# Patient Record
Sex: Female | Born: 1970 | Race: White | Hispanic: No | Marital: Married | Smoking: Never smoker
Health system: Southern US, Community
[De-identification: ages and names within clinical notes are randomized; demographics above are authoritative.]

## PROBLEM LIST (undated history)

## (undated) DIAGNOSIS — Z8619 Personal history of other infectious and parasitic diseases: Secondary | ICD-10-CM

## (undated) DIAGNOSIS — J45909 Unspecified asthma, uncomplicated: Secondary | ICD-10-CM

## (undated) DIAGNOSIS — Z87440 Personal history of urinary (tract) infections: Secondary | ICD-10-CM

## (undated) DIAGNOSIS — T7840XA Allergy, unspecified, initial encounter: Secondary | ICD-10-CM

## (undated) DIAGNOSIS — Z87718 Personal history of other specified (corrected) congenital malformations of genitourinary system: Secondary | ICD-10-CM

## (undated) DIAGNOSIS — Z8709 Personal history of other diseases of the respiratory system: Secondary | ICD-10-CM

## (undated) DIAGNOSIS — Z8742 Personal history of other diseases of the female genital tract: Secondary | ICD-10-CM

## (undated) HISTORY — PX: WISDOM TOOTH EXTRACTION: SHX21

## (undated) HISTORY — DX: Personal history of other infectious and parasitic diseases: Z86.19

## (undated) HISTORY — DX: Allergy, unspecified, initial encounter: T78.40XA

## (undated) HISTORY — DX: Unspecified asthma, uncomplicated: J45.909

## (undated) HISTORY — DX: Personal history of other specified (corrected) congenital malformations of genitourinary system: Z87.718

## (undated) HISTORY — DX: Personal history of other diseases of the respiratory system: Z87.09

## (undated) HISTORY — PX: OTHER SURGICAL HISTORY: SHX169

## (undated) HISTORY — DX: Personal history of urinary (tract) infections: Z87.440

## (undated) HISTORY — DX: Personal history of other diseases of the female genital tract: Z87.42

---

## 2015-05-20 LAB — HM MAMMOGRAPHY: HM MAMMO: NORMAL (ref 0–4)

## 2015-05-20 LAB — HM PAP SMEAR

## 2016-05-16 ENCOUNTER — Telehealth: Payer: Self-pay | Admitting: Emergency Medicine

## 2016-05-16 ENCOUNTER — Encounter: Payer: Self-pay | Admitting: Emergency Medicine

## 2016-05-16 NOTE — Telephone Encounter (Signed)
Pre-Visit Call completed with patient and chart updated.   Pre-Visit Info documented in Specialty Comments under Snap[employer].    

## 2016-05-19 ENCOUNTER — Encounter: Payer: Self-pay | Admitting: General Practice

## 2016-05-19 ENCOUNTER — Encounter: Payer: Self-pay | Admitting: Family Medicine

## 2016-05-19 ENCOUNTER — Telehealth: Payer: Self-pay | Admitting: Family Medicine

## 2016-05-19 ENCOUNTER — Ambulatory Visit (INDEPENDENT_AMBULATORY_CARE_PROVIDER_SITE_OTHER): Payer: Managed Care, Other (non HMO) | Admitting: Family Medicine

## 2016-05-19 VITALS — BP 102/64 | HR 71 | Temp 98.8°F | Resp 16 | Ht 65.0 in | Wt 156.4 lb

## 2016-05-19 DIAGNOSIS — Z0000 Encounter for general adult medical examination without abnormal findings: Secondary | ICD-10-CM

## 2016-05-19 DIAGNOSIS — Z23 Encounter for immunization: Secondary | ICD-10-CM

## 2016-05-19 LAB — HEPATIC FUNCTION PANEL
ALT: 15 U/L (ref 0–35)
AST: 17 U/L (ref 0–37)
Albumin: 4.5 g/dL (ref 3.5–5.2)
Alkaline Phosphatase: 44 U/L (ref 39–117)
BILIRUBIN TOTAL: 0.6 mg/dL (ref 0.2–1.2)
Bilirubin, Direct: 0.1 mg/dL (ref 0.0–0.3)
Total Protein: 7.2 g/dL (ref 6.0–8.3)

## 2016-05-19 LAB — CBC WITH DIFFERENTIAL/PLATELET
BASOS PCT: 0.9 % (ref 0.0–3.0)
Basophils Absolute: 0 10*3/uL (ref 0.0–0.1)
EOS PCT: 2.1 % (ref 0.0–5.0)
Eosinophils Absolute: 0.1 10*3/uL (ref 0.0–0.7)
HEMATOCRIT: 45.4 % (ref 36.0–46.0)
HEMOGLOBIN: 15.8 g/dL — AB (ref 12.0–15.0)
Lymphocytes Relative: 42.3 % (ref 12.0–46.0)
Lymphs Abs: 1.8 10*3/uL (ref 0.7–4.0)
MCHC: 34.9 g/dL (ref 30.0–36.0)
MCV: 90.1 fl (ref 78.0–100.0)
MONO ABS: 0.4 10*3/uL (ref 0.1–1.0)
MONOS PCT: 9.4 % (ref 3.0–12.0)
Neutro Abs: 1.9 10*3/uL (ref 1.4–7.7)
Neutrophils Relative %: 45.3 % (ref 43.0–77.0)
Platelets: 167 10*3/uL (ref 150.0–400.0)
RBC: 5.04 Mil/uL (ref 3.87–5.11)
RDW: 13 % (ref 11.5–15.5)
WBC: 4.1 10*3/uL (ref 4.0–10.5)

## 2016-05-19 LAB — BASIC METABOLIC PANEL
BUN: 17 mg/dL (ref 6–23)
CHLORIDE: 105 meq/L (ref 96–112)
CO2: 30 mEq/L (ref 19–32)
CREATININE: 0.93 mg/dL (ref 0.40–1.20)
Calcium: 9.1 mg/dL (ref 8.4–10.5)
GFR: 69.26 mL/min (ref >60.00)
Glucose, Bld: 84 mg/dL (ref 70–99)
POTASSIUM: 4.4 meq/L (ref 3.5–5.1)
Sodium: 140 mEq/L (ref 135–145)

## 2016-05-19 LAB — LIPID PANEL
CHOL/HDL RATIO: 3
Cholesterol: 164 mg/dL (ref 0–200)
HDL: 60.6 mg/dL (ref >39.00)
LDL Cholesterol: 86 mg/dL (ref 0–99)
NONHDL: 103.53
Triglycerides: 90 mg/dL (ref 0.0–149.0)
VLDL: 18 mg/dL (ref 0.0–40.0)

## 2016-05-19 LAB — TSH: TSH: 3.59 u[IU]/mL (ref 0.35–4.50)

## 2016-05-19 LAB — VITAMIN D 25 HYDROXY (VIT D DEFICIENCY, FRACTURES): VITD: 30.59 ng/mL (ref 30.00–100.00)

## 2016-05-19 NOTE — Patient Instructions (Signed)
Follow up in 1 year or as needed We'll notify you of your lab results and make any changes if needed Keep up the good work on healthy diet and regular exercise- you look great! Call with any questions or concerns Welcome!  We're glad to have you!!! 

## 2016-05-19 NOTE — Telephone Encounter (Signed)
Pt states she forgot to ask for a note/letter stating that she receiving her flu shot today, pt states she needs this for work.

## 2016-05-19 NOTE — Progress Notes (Signed)
Pre visit review using our clinic review tool, if applicable. No additional management support is needed unless otherwise documented below in the visit note. 

## 2016-05-19 NOTE — Progress Notes (Signed)
   Subjective:    Patient ID: Kristi Dillon, female    DOB: 1970/11/10, 45 y.o.   MRN: [id number]  HPI New to establish.  Previous MD- [facility]  GYN- [facility] (UTD)  CPE- no concerns   Review of Systems Patient reports no vision/ hearing changes, adenopathy,fever, weight change,  persistant/recurrent hoarseness , swallowing issues, chest pain, palpitations, edema, persistant/recurrent cough, hemoptysis, dyspnea (rest/exertional/paroxysmal nocturnal), gastrointestinal bleeding (melena, rectal bleeding), abdominal pain, significant heartburn, bowel changes, GU symptoms (dysuria, hematuria, incontinence), Gyn symptoms (abnormal  bleeding, pain),  syncope, focal weakness, memory loss, numbness & tingling, skin/hair/nail changes, abnormal bruising or bleeding, anxiety, or depression.     Objective:   Physical Exam General Appearance:    Alert, cooperative, no distress, appears stated age  Head:    Normocephalic, without obvious abnormality, atraumatic  Eyes:    PERRL, conjunctiva/corneas clear, EOM's intact, fundi    benign, both eyes  Ears:    Normal TM's and external ear canals, both ears  Nose:   Nares normal, septum midline, mucosa normal, no drainage    or sinus tenderness  Throat:   Lips, mucosa, and tongue normal; teeth and gums normal  Neck:   Supple, symmetrical, trachea midline, no adenopathy;    Thyroid: no enlargement/tenderness/nodules  Back:     Symmetric, no curvature, ROM normal, no CVA tenderness  Lungs:     Clear to auscultation bilaterally, respirations unlabored  Chest Wall:    No tenderness or deformity   Heart:    Regular rate and rhythm, S1 and S2 normal, no murmur, rub   or gallop  Breast Exam:    Deferred to GYN  Abdomen:     Soft, non-tender, bowel sounds active all four quadrants,    no masses, no organomegaly  Genitalia:    Deferred to GYN  Rectal:    Extremities:   Extremities normal, atraumatic, no cyanosis or edema  Pulses:   2+ and symmetric all  extremities  Skin:   Skin color, texture, turgor normal, no rashes or lesions  Lymph nodes:   Cervical, supraclavicular, and axillary nodes normal  Neurologic:   CNII-XII intact, normal strength, sensation and reflexes    throughout          Assessment & Plan:  PE- UTD on GYN.  Check labs.  Flu shot given.  Anticipatory guidance provided.

## 2016-05-19 NOTE — Telephone Encounter (Signed)
Letter written and pt was informed available at front desk for pick up.

## 2016-06-16 ENCOUNTER — Ambulatory Visit: Payer: Self-pay | Admitting: Family Medicine

## 2016-09-22 ENCOUNTER — Encounter: Payer: Self-pay | Admitting: Physician Assistant

## 2016-09-22 ENCOUNTER — Ambulatory Visit (INDEPENDENT_AMBULATORY_CARE_PROVIDER_SITE_OTHER): Payer: Managed Care, Other (non HMO) | Admitting: Physician Assistant

## 2016-09-22 VITALS — BP 110/78 | HR 70 | Temp 97.7°F | Resp 16 | Ht 65.0 in | Wt 162.0 lb

## 2016-09-22 DIAGNOSIS — J0100 Acute maxillary sinusitis, unspecified: Secondary | ICD-10-CM

## 2016-09-22 MED ORDER — BENZONATATE 100 MG PO CAPS: 100.0000 mg | ORAL_CAPSULE | Freq: Two times a day (BID) | ORAL | 0 refills | Status: DC | PRN

## 2016-09-22 MED ORDER — AMOXICILLIN-POT CLAVULANATE 875-125 MG PO TABS
1.0000 | ORAL_TABLET | Freq: Two times a day (BID) | ORAL | 0 refills | Status: DC
Start: 2016-09-22 — End: 2016-12-10

## 2016-09-22 NOTE — Progress Notes (Signed)
Pre visit review using our clinic review tool, if applicable. No additional management support is needed unless otherwise documented below in the visit note. 

## 2016-09-22 NOTE — Progress Notes (Signed)
Patient presents to clinic today c/o 4 weeks of cough with sinus congestion, pressure and pain along with PND and sore throat. Endorses maxillary facial pain.   Denies fever, chills or aches.  Patient is not a smoker. Denies history of COPD. Has history of allergic asthma -- not currently on any treatment.  Does note some mild chest tightness. Denies wheezing.   Has used Neti pot and Tylenol sinus to help with symptoms. Only mild help with symptoms.  Past Medical History:  Diagnosis Date  . Allergy   . Asthma   . History of chicken pox   . History of chronic bronchitis   . History of Mayer-Rokitansky-Kster-Hauser syndrome type II   . History of urinary tract infection     Current Outpatient Prescriptions on File Prior to Visit  Medication Sig Dispense Refill  . diphenhydrAMINE (BENADRYL) 25 MG tablet Take by mouth.     No current facility-administered medications on file prior to visit.     Allergies  Allergen Reactions  . Prednisone Rash    Family History  Problem Relation Age of Onset  . Hyperlipidemia Father   . Arthritis Father   . Heart disease Father   . Cancer Maternal Grandmother     Skin  . Arthritis Maternal Grandmother   . Alzheimer's disease Maternal Grandmother   . Arthritis Paternal Grandmother   . Cancer Paternal Grandmother     breast  . Cancer Paternal Grandfather     Stomach  . Arthritis Paternal Grandfather   . Alcohol abuse Paternal Uncle   . Arthritis Maternal Grandfather   . Cancer Paternal Uncle     lung    Social History   Social History  . Marital status: Married    Spouse name: N/A  . Number of children: N/A  . Years of education: N/A   Social History Main Topics  . Smoking status: Never Smoker  . Smokeless tobacco: Never Used  . Alcohol use Yes     Comment: 2-3 a week  . Drug use: No  . Sexual activity: Not Asked   Other Topics Concern  . None   Social History Narrative  . None   Review of Systems - See HPI.   All other ROS are negative.  BP 110/78   Pulse 70   Temp 97.7 F (36.5 C) (Oral)   Resp 16   Ht 5\' 5"  (1.651 m)   Wt 162 lb (73.5 kg)   SpO2 99%   BMI 26.96 kg/m   Physical Exam  Constitutional: She is oriented to person, place, and time and well-developed, well-nourished, and in no distress.  HENT:  Head: Normocephalic and atraumatic.  Right Ear: Tympanic membrane normal.  Left Ear: Tympanic membrane normal.  Nose: Right sinus exhibits maxillary sinus tenderness. Left sinus exhibits maxillary sinus tenderness.  Mouth/Throat: Uvula is midline, oropharynx is clear and moist and mucous membranes are normal.  Eyes: Conjunctivae are normal.  Neck: Neck supple.  Cardiovascular: Normal rate, regular rhythm, normal heart sounds and intact distal pulses.   Pulmonary/Chest: Effort normal and breath sounds normal. No respiratory distress. She has no wheezes. She has no rales. She exhibits no tenderness.  Lymphadenopathy:    She has no cervical adenopathy.  Neurological: She is alert and oriented to person, place, and time.  Skin: Skin is warm and dry. No rash noted.  Psychiatric: Affect normal.  Vitals reviewed.  Assessment/Plan: 1. Acute non-recurrent maxillary sinusitis Rx Augmentin. Tessalon per orders. OTC medications and  supportive measures reviewed. FU if not resolving.   - amoxicillin-clavulanate (AUGMENTIN) 875-125 MG tablet; Take 1 tablet by mouth 2 (two) times daily.  Dispense: 14 tablet; Refill: 0 - benzonatate (TESSALON) 100 MG capsule; Take 1 capsule (100 mg total) by mouth 2 (two) times daily as needed for cough.  Dispense: 20 capsule; Refill: 0   Leeanne Rio, [place]

## 2016-09-22 NOTE — Patient Instructions (Signed)
Please take antibiotic as directed.  Increase fluid intake.  Use Saline nasal spray.  Take a daily multivitamin. Continue Tylenol Sinus.  Place a humidifier in the bedroom.  Please call or return clinic if symptoms are not improving.  Sinusitis Sinusitis is redness, soreness, and swelling (inflammation) of the paranasal sinuses. Paranasal sinuses are air pockets within the bones of your face (beneath the eyes, the middle of the forehead, or above the eyes). In healthy paranasal sinuses, mucus is able to drain out, and air is able to circulate through them by way of your nose. However, when your paranasal sinuses are inflamed, mucus and air can become trapped. This can allow bacteria and other germs to grow and cause infection. Sinusitis can develop quickly and last only a short time (acute) or continue over a long period (chronic). Sinusitis that lasts for more than 12 weeks is considered chronic.  CAUSES  Causes of sinusitis include:  Allergies.  Structural abnormalities, such as displacement of the cartilage that separates your nostrils (deviated septum), which can decrease the air flow through your nose and sinuses and affect sinus drainage.  Functional abnormalities, such as when the small hairs (cilia) that line your sinuses and help remove mucus do not work properly or are not present. SYMPTOMS  Symptoms of acute and chronic sinusitis are the same. The primary symptoms are pain and pressure around the affected sinuses. Other symptoms include:  Upper toothache.  Earache.  Headache.  Bad breath.  Decreased sense of smell and taste.  A cough, which worsens when you are lying flat.  Fatigue.  Fever.  Thick drainage from your nose, which often is green and may contain pus (purulent).  Swelling and warmth over the affected sinuses. DIAGNOSIS  Your caregiver will perform a physical exam. During the exam, your caregiver may:  Look in your nose for signs of abnormal growths in your  nostrils (nasal polyps).  Tap over the affected sinus to check for signs of infection.  View the inside of your sinuses (endoscopy) with a special imaging device with a light attached (endoscope), which is inserted into your sinuses. If your caregiver suspects that you have chronic sinusitis, one or more of the following tests may be recommended:  Allergy tests.  Nasal culture A sample of mucus is taken from your nose and sent to a lab and screened for bacteria.  Nasal cytology A sample of mucus is taken from your nose and examined by your caregiver to determine if your sinusitis is related to an allergy. TREATMENT  Most cases of acute sinusitis are related to a viral infection and will resolve on their own within 10 days. Sometimes medicines are prescribed to help relieve symptoms (pain medicine, decongestants, nasal steroid sprays, or saline sprays).  However, for sinusitis related to a bacterial infection, your caregiver will prescribe antibiotic medicines. These are medicines that will help kill the bacteria causing the infection.  Rarely, sinusitis is caused by a fungal infection. In theses cases, your caregiver will prescribe antifungal medicine. For some cases of chronic sinusitis, surgery is needed. Generally, these are cases in which sinusitis recurs more than 3 times per year, despite other treatments. HOME CARE INSTRUCTIONS   Drink plenty of water. Water helps thin the mucus so your sinuses can drain more easily.  Use a humidifier.  Inhale steam 3 to 4 times a day (for example, sit in the bathroom with the shower running).  Apply a warm, moist washcloth to your face 3 to 4  times a day, or as directed by your caregiver.  Use saline nasal sprays to help moisten and clean your sinuses.  Take over-the-counter or prescription medicines for pain, discomfort, or fever only as directed by your caregiver. SEEK IMMEDIATE MEDICAL CARE IF:  You have increasing pain or severe  headaches.  You have nausea, vomiting, or drowsiness.  You have swelling around your face.  You have vision problems.  You have a stiff neck.  You have difficulty breathing. MAKE SURE YOU:   Understand these instructions.  Will watch your condition.  Will get help right away if you are not doing well or get worse. Document Released: 08/11/2005 Document Revised: 11/03/2011 Document Reviewed: 08/26/2011 [employer] Patient Information 2014 [employer].   

## 2016-12-10 ENCOUNTER — Ambulatory Visit (INDEPENDENT_AMBULATORY_CARE_PROVIDER_SITE_OTHER): Payer: Managed Care, Other (non HMO) | Admitting: Family Medicine

## 2016-12-10 ENCOUNTER — Encounter: Payer: Self-pay | Admitting: Family Medicine

## 2016-12-10 VITALS — BP 111/80 | HR 78 | Temp 98.1°F | Resp 16 | Ht 65.0 in | Wt 164.5 lb

## 2016-12-10 DIAGNOSIS — R350 Frequency of micturition: Secondary | ICD-10-CM | POA: Diagnosis not present

## 2016-12-10 DIAGNOSIS — R399 Unspecified symptoms and signs involving the genitourinary system: Secondary | ICD-10-CM

## 2016-12-10 LAB — POCT URINALYSIS DIPSTICK
BILIRUBIN UA: NEGATIVE
Glucose, UA: NEGATIVE
KETONES UA: NEGATIVE
Leukocytes, UA: NEGATIVE
Nitrite, UA: NEGATIVE
PH UA: 6 (ref 5.0–8.0)
PROTEIN UA: NEGATIVE
RBC UA: NEGATIVE
Urobilinogen, UA: 0.2 E.U./dL

## 2016-12-10 MED ORDER — MIRABEGRON ER 25 MG PO TB24: 25.0000 mg | ORAL_TABLET | Freq: Every day | ORAL | 6 refills | Status: DC

## 2016-12-10 NOTE — Assessment & Plan Note (Signed)
New.  Pt reports she has always gone to the bathroom frequently but since Friday there has been a marked change.  Pt is now needing to use the bathroom every 20-30 minutes.  Will send urine for culture but UA is WNL.  Start Myrbetriq daily and monitor for improvement.

## 2016-12-10 NOTE — Progress Notes (Signed)
   Subjective:    Patient ID: Kristi Dillon, female    DOB: February 25, 1971, 46 y.o.   MRN: [id number]  HPI Urinary frequency- 'i'm spending a ton of time in the restroom lately'.  Pt reports she has always gone frequently and it's a running joke in the family but sxs have worsened lately.  Pt feels the urge to void every 20-30 minutes.  If she pushed it to the 1 hr mark 'it's a mad dash' and sometimes 'barely makes it'.  Pt has attempted to cut back on fluid intake but this has not made a difference.  sxs started suddenly on Friday.  No burning, no blood in urine.     Review of Systems For ROS see HPI     Objective:   Physical Exam  Constitutional: She is oriented to person, place, and time. She appears well-developed and well-nourished. No distress.  HENT:  Head: Normocephalic and atraumatic.  Abdominal: Soft. Bowel sounds are normal. She exhibits no distension. There is no tenderness. There is no rebound and no guarding.  Neurological: She is alert and oriented to person, place, and time.  Skin: Skin is warm and dry.  Psychiatric: She has a normal mood and affect. Her behavior is normal. Thought content normal.  Vitals reviewed.         Assessment & Plan:

## 2016-12-10 NOTE — Patient Instructions (Signed)
Follow up in 2-3 weeks and let me know how the urinary frequency is going We'll notify you of your urine culture results and treat if needed Start the Myrbetriq once daily Call with any questions or concerns Hang in there!!!

## 2016-12-10 NOTE — Progress Notes (Signed)
Pre visit review using our clinic review tool, if applicable. No additional management support is needed unless otherwise documented below in the visit note. 

## 2016-12-11 ENCOUNTER — Other Ambulatory Visit: Payer: Self-pay | Admitting: General Practice

## 2016-12-11 MED ORDER — MIRABEGRON ER 25 MG PO TB24: 25.0000 mg | ORAL_TABLET | Freq: Every day | ORAL | 0 refills | Status: DC

## 2016-12-12 ENCOUNTER — Telehealth: Payer: Self-pay | Admitting: *Deleted

## 2016-12-12 DIAGNOSIS — N3281 Overactive bladder: Secondary | ICD-10-CM

## 2016-12-12 LAB — CULTURE, URINE COMPREHENSIVE

## 2016-12-12 NOTE — Telephone Encounter (Addendum)
Received a fax from pharmacy that Myrbetriq is not covered through insurance, however it does state that an ER form of a generic is covered.   Can a new RX for generic be sent?

## 2016-12-15 ENCOUNTER — Encounter: Payer: Self-pay | Admitting: Family Medicine

## 2016-12-15 NOTE — Telephone Encounter (Signed)
Thank you!  None of the other medicines are comparable so I appreciate you trying to get the Myrbetriq approved.  Please let pt know we are waiting on the response

## 2016-12-15 NOTE — Telephone Encounter (Signed)
Left message for patient to let her know that I have sent this to insurance and I am waiting on a response from them.  Let her know that I will call her as soon as I have another update, but if she had any questions she is free to call me.

## 2016-12-15 NOTE — Telephone Encounter (Signed)
Attempted prior authorization again, and it was sent to the insurance company for approval.      KEY: [facility]   Awaiting a reply from insurance company

## 2016-12-15 NOTE — Telephone Encounter (Signed)
If a generic exists, I'd be happy to switch it (but I didn't think that was the case)

## 2016-12-15 NOTE — Telephone Encounter (Signed)
This is what comes up when I try to do a prior authorization.    If none of these are acceptable, I will call the insurance company.   Generic therapeutic alternatives for Myrbetriq are available  Oxybutynin Chloride  Oxybutynin Chloride ER  Tolterodine Tartrate  Trospium Chloride ER  Trospium Chloride  Tolterodine Tartrate ER

## 2016-12-18 NOTE — Telephone Encounter (Signed)
Prior Authorization for medication denied from insurance company.

## 2016-12-22 MED ORDER — DARIFENACIN HYDROBROMIDE ER 7.5 MG PO TB24: 7.5000 mg | ORAL_TABLET | Freq: Every day | ORAL | 3 refills | Status: DC

## 2016-12-22 NOTE — Telephone Encounter (Signed)
Please start Darifenacin ER 7.5mg  daily for OAB since the Myrbetriq was denied.  #30, 3 refills.  Thanks

## 2016-12-22 NOTE — Telephone Encounter (Signed)
Medication called in.    Left patient a detailed vm that meds were called in to try since Myrbetriq was denied.

## 2016-12-22 NOTE — Addendum Note (Signed)
Addended by: Katina Dung on: 12/22/2016 12:25 PM   Modules accepted: Orders

## 2017-01-28 ENCOUNTER — Other Ambulatory Visit: Payer: Self-pay | Admitting: General Practice

## 2017-01-28 DIAGNOSIS — N3281 Overactive bladder: Secondary | ICD-10-CM

## 2017-01-28 MED ORDER — DARIFENACIN HYDROBROMIDE ER 7.5 MG PO TB24: 7.5000 mg | ORAL_TABLET | Freq: Every day | ORAL | 0 refills | Status: DC

## 2017-03-21 LAB — HM MAMMOGRAPHY: HM Mammogram: NORMAL (ref 0–4)

## 2017-05-21 ENCOUNTER — Encounter: Payer: Managed Care, Other (non HMO) | Admitting: Family Medicine

## 2017-05-22 ENCOUNTER — Encounter: Payer: Self-pay | Admitting: General Practice

## 2017-05-22 ENCOUNTER — Ambulatory Visit (INDEPENDENT_AMBULATORY_CARE_PROVIDER_SITE_OTHER): Payer: Managed Care, Other (non HMO) | Admitting: Family Medicine

## 2017-05-22 ENCOUNTER — Encounter: Payer: Self-pay | Admitting: Family Medicine

## 2017-05-22 DIAGNOSIS — Z0000 Encounter for general adult medical examination without abnormal findings: Secondary | ICD-10-CM

## 2017-05-22 DIAGNOSIS — Z23 Encounter for immunization: Secondary | ICD-10-CM

## 2017-05-22 LAB — TSH: TSH: 1.94 u[IU]/mL (ref 0.35–4.50)

## 2017-05-22 LAB — BASIC METABOLIC PANEL
BUN: 17 mg/dL (ref 6–23)
CO2: 28 mEq/L (ref 19–32)
Calcium: 9.8 mg/dL (ref 8.4–10.5)
Chloride: 101 mEq/L (ref 96–112)
Creatinine, Ser: 0.83 mg/dL (ref 0.40–1.20)
GFR: 78.62 mL/min (ref >60.00)
GLUCOSE: 90 mg/dL (ref 70–99)
Potassium: 4.3 mEq/L (ref 3.5–5.1)
Sodium: 136 mEq/L (ref 135–145)

## 2017-05-22 LAB — VITAMIN D 25 HYDROXY (VIT D DEFICIENCY, FRACTURES): VITD: 30.89 ng/mL (ref 30.00–100.00)

## 2017-05-22 LAB — LIPID PANEL
CHOL/HDL RATIO: 4
Cholesterol: 202 mg/dL — ABNORMAL HIGH (ref 0–200)
HDL: 57.4 mg/dL (ref >39.00)
LDL CALC: 126 mg/dL — AB (ref 0–99)
NonHDL: 144.4
TRIGLYCERIDES: 92 mg/dL (ref 0.0–149.0)
VLDL: 18.4 mg/dL (ref 0.0–40.0)

## 2017-05-22 LAB — CBC WITH DIFFERENTIAL/PLATELET
BASOS ABS: 0 10*3/uL (ref 0.0–0.1)
Basophils Relative: 0.5 % (ref 0.0–3.0)
EOS PCT: 2.1 % (ref 0.0–5.0)
Eosinophils Absolute: 0.1 10*3/uL (ref 0.0–0.7)
HEMATOCRIT: 46.1 % — AB (ref 36.0–46.0)
HEMOGLOBIN: 15.9 g/dL — AB (ref 12.0–15.0)
Lymphocytes Relative: 39.2 % (ref 12.0–46.0)
Lymphs Abs: 1.9 10*3/uL (ref 0.7–4.0)
MCHC: 34.4 g/dL (ref 30.0–36.0)
MCV: 90.7 fl (ref 78.0–100.0)
MONO ABS: 0.4 10*3/uL (ref 0.1–1.0)
MONOS PCT: 8.9 % (ref 3.0–12.0)
NEUTROS PCT: 49.3 % (ref 43.0–77.0)
Neutro Abs: 2.4 10*3/uL (ref 1.4–7.7)
PLATELETS: 194 10*3/uL (ref 150.0–400.0)
RBC: 5.08 Mil/uL (ref 3.87–5.11)
RDW: 12.9 % (ref 11.5–15.5)
WBC: 4.8 10*3/uL (ref 4.0–10.5)

## 2017-05-22 LAB — HEPATIC FUNCTION PANEL
ALBUMIN: 4.8 g/dL (ref 3.5–5.2)
ALT: 20 U/L (ref 0–35)
AST: 21 U/L (ref 0–37)
Alkaline Phosphatase: 38 U/L — ABNORMAL LOW (ref 39–117)
Bilirubin, Direct: 0.1 mg/dL (ref 0.0–0.3)
TOTAL PROTEIN: 7.2 g/dL (ref 6.0–8.3)
Total Bilirubin: 0.8 mg/dL (ref 0.2–1.2)

## 2017-05-22 NOTE — Progress Notes (Signed)
Pre visit review using our clinic review tool, if applicable. No additional management support is needed unless otherwise documented below in the visit note. 

## 2017-05-22 NOTE — Patient Instructions (Signed)
Follow up in 1 year or as needed We'll notify you of your lab results and make any changes if needed Keep up the good work on healthy diet and regular exercise- you look great!!! Schedule a nurse visit at your convenience for your Tdap (tetanus shot) Call with any questions or concerns Happy Fall!!!

## 2017-05-22 NOTE — Assessment & Plan Note (Signed)
Pt's PE WNL.  UTD on GYN.  Flu shot given.  Check labs.  Anticipatory guidance provided.

## 2017-05-22 NOTE — Progress Notes (Signed)
   Subjective:    Patient ID: Kristi Dillon, female    DOB: Mar 28, 1971, 46 y.o.   MRN: [id number]  HPI CPE- UTD GYN (pap, mammo).  Due for flu and Tdap.     Review of Systems Patient reports no vision/ hearing changes, adenopathy,fever, weight change,  persistant/recurrent hoarseness , swallowing issues, chest pain, palpitations, edema, persistant/recurrent cough, hemoptysis, dyspnea (rest/exertional/paroxysmal nocturnal), gastrointestinal bleeding (melena, rectal bleeding), abdominal pain, significant heartburn, bowel changes, GU symptoms (dysuria, hematuria, incontinence), Gyn symptoms (abnormal  bleeding, pain),  syncope, focal weakness, memory loss, numbness & tingling, skin/hair/nail changes, abnormal bruising or bleeding, anxiety, or depression.     Objective:   Physical Exam General Appearance:    Alert, cooperative, no distress, appears stated age  Head:    Normocephalic, without obvious abnormality, atraumatic  Eyes:    PERRL, conjunctiva/corneas clear, EOM's intact, fundi    benign, both eyes  Ears:    Normal TM's and external ear canals, both ears  Nose:   Nares normal, septum midline, mucosa normal, no drainage    or sinus tenderness  Throat:   Lips, mucosa, and tongue normal; teeth and gums normal  Neck:   Supple, symmetrical, trachea midline, no adenopathy;    Thyroid: no enlargement/tenderness/nodules  Back:     Symmetric, no curvature, ROM normal, no CVA tenderness  Lungs:     Clear to auscultation bilaterally, respirations unlabored  Chest Wall:    No tenderness or deformity   Heart:    Regular rate and rhythm, S1 and S2 normal, no murmur, rub   or gallop  Breast Exam:    Deferred to GYN  Abdomen:     Soft, non-tender, bowel sounds active all four quadrants,    no masses, no organomegaly  Genitalia:    Deferred to GYN  Rectal:    Extremities:   Extremities normal, atraumatic, no cyanosis or edema  Pulses:   2+ and symmetric all extremities  Skin:   Skin color,  texture, turgor normal, no rashes or lesions  Lymph nodes:   Cervical, supraclavicular, and axillary nodes normal  Neurologic:   CNII-XII intact, normal strength, sensation and reflexes    throughout          Assessment & Plan:

## 2017-05-25 ENCOUNTER — Encounter: Payer: Self-pay | Admitting: General Practice

## 2017-05-27 ENCOUNTER — Other Ambulatory Visit: Payer: Self-pay | Admitting: Family Medicine

## 2017-05-27 DIAGNOSIS — N3281 Overactive bladder: Secondary | ICD-10-CM

## 2017-08-21 ENCOUNTER — Other Ambulatory Visit: Payer: Self-pay | Admitting: Emergency Medicine

## 2017-08-21 DIAGNOSIS — N3281 Overactive bladder: Secondary | ICD-10-CM

## 2017-08-21 MED ORDER — DARIFENACIN HYDROBROMIDE ER 7.5 MG PO TB24: 7.5000 mg | ORAL_TABLET | Freq: Every day | ORAL | 1 refills | Status: DC

## 2017-10-13 ENCOUNTER — Ambulatory Visit (INDEPENDENT_AMBULATORY_CARE_PROVIDER_SITE_OTHER): Payer: Managed Care, Other (non HMO) | Admitting: Family Medicine

## 2017-10-13 ENCOUNTER — Encounter: Payer: Self-pay | Admitting: Family Medicine

## 2017-10-13 VITALS — BP 120/78 | HR 76 | Temp 98.9°F | Wt 165.0 lb

## 2017-10-13 DIAGNOSIS — J208 Acute bronchitis due to other specified organisms: Secondary | ICD-10-CM

## 2017-10-13 DIAGNOSIS — B9689 Other specified bacterial agents as the cause of diseases classified elsewhere: Secondary | ICD-10-CM

## 2017-10-13 LAB — POCT INFLUENZA A/B
INFLUENZA A, POC: NEGATIVE
INFLUENZA B, POC: NEGATIVE

## 2017-10-13 MED ORDER — AZITHROMYCIN 250 MG PO TABS: ORAL_TABLET | ORAL | 0 refills | Status: DC

## 2017-10-13 NOTE — Patient Instructions (Signed)
Please follow up if symptoms do not improve or as needed.    Acute Bronchitis, Adult Acute bronchitis is sudden (acute) swelling of the air tubes (bronchi) in the lungs. Acute bronchitis causes these tubes to fill with mucus, which can make it hard to breathe. It can also cause coughing or wheezing. In adults, acute bronchitis usually goes away within 2 weeks. A cough caused by bronchitis may last up to 3 weeks. Smoking, allergies, and asthma can make the condition worse. Repeated episodes of bronchitis may cause further lung problems, such as chronic obstructive pulmonary disease (COPD). What are the causes? This condition can be caused by germs and by substances that irritate the lungs, including:  Cold and flu viruses. This condition is most often caused by the same virus that causes a cold.  Bacteria.  Exposure to tobacco smoke, dust, fumes, and air pollution.  What increases the risk? This condition is more likely to develop in people who:  Have close contact with someone with acute bronchitis.  Are exposed to lung irritants, such as tobacco smoke, dust, fumes, and vapors.  Have a weak immune system.  Have a respiratory condition such as asthma.  What are the signs or symptoms? Symptoms of this condition include:  A cough.  Coughing up clear, yellow, or green mucus.  Wheezing.  Chest congestion.  Shortness of breath.  A fever.  Body aches.  Chills.  A sore throat.  How is this diagnosed? This condition is usually diagnosed with a physical exam. During the exam, your health care provider may order tests, such as chest X-rays, to rule out other conditions. He or she may also:  Test a sample of your mucus for bacterial infection.  Check the level of oxygen in your blood. This is done to check for pneumonia.  Do a chest X-ray or lung function testing to rule out pneumonia and other conditions.  Perform blood tests.  Your health care provider will also ask  about your symptoms and medical history. How is this treated? Most cases of acute bronchitis clear up over time without treatment. Your health care provider may recommend:  Drinking more fluids. Drinking more makes your mucus thinner, which may make it easier to breathe.  Taking a medicine for a fever or cough.  Taking an antibiotic medicine.  Using an inhaler to help improve shortness of breath and to control a cough.  Using a cool mist vaporizer or humidifier to make it easier to breathe.  Follow these instructions at home: Medicines  Take over-the-counter and prescription medicines only as told by your health care provider.  If you were prescribed an antibiotic, take it as told by your health care provider. Do not stop taking the antibiotic even if you start to feel better. General instructions  Get plenty of rest.  Drink enough fluids to keep your urine clear or pale yellow.  Avoid smoking and secondhand smoke. Exposure to cigarette smoke or irritating chemicals will make bronchitis worse. If you smoke and you need help quitting, ask your health care provider. Quitting smoking will help your lungs heal faster.  Use an inhaler, cool mist vaporizer, or humidifier as told by your health care provider.  Keep all follow-up visits as told by your health care provider. This is important. How is this prevented? To lower your risk of getting this condition again:  Wash your hands often with soap and water. If soap and water are not available, use hand sanitizer.  Avoid contact with   people who have cold symptoms.  Try not to touch your hands to your mouth, nose, or eyes.  Make sure to get the flu shot every year.  Contact a health care provider if:  Your symptoms do not improve in 2 weeks of treatment. Get help right away if:  You cough up blood.  You have chest pain.  You have severe shortness of breath.  You become dehydrated.  You faint or keep feeling like you are  going to faint.  You keep vomiting.  You have a severe headache.  Your fever or chills gets worse. This information is not intended to replace advice given to you by your health care provider. Make sure you discuss any questions you have with your health care provider. Document Released: 09/18/2004 Document Revised: 03/05/2016 Document Reviewed: 01/30/2016 Elsevier Interactive Patient Education  2018 [employer]   

## 2017-10-13 NOTE — Addendum Note (Signed)
Addended by: Milford Cage on: 10/13/2017 01:09 PM   Modules accepted: Orders

## 2017-10-13 NOTE — Progress Notes (Signed)
Subjective  CC:  Chief Complaint  Patient presents with  . Cough    started friday/ sore throat/ cough/ drainage     HPI: SUBJECTIVE:  Kristi Dillon is a 47 y.o. female who complains of congestion, nasal blockage, post nasal drip, cough described as productive and denies sinus, high fevers, SOB, myalgias, chest pain or significant GI symptoms. Symptoms have been present for 4-5 days. She travels for work and thus has had exposures to illness on planes. She denies a history of anorexia, dizziness, vomiting and wheezing. She denies a history of active asthma or COPD. Patient does not smoke cigarettes. She has had the flu shot this seaseon.  I reviewed the patients updated PMH, FH, and SocHx.  Social History: Patient  reports that  has never smoked. she has never used smokeless tobacco. She reports that she drinks alcohol. She reports that she does not use drugs.  Patient Active Problem List   Diagnosis Date Noted  . Physical exam 05/22/2017  . Urinary frequency 12/10/2016    Review of Systems: Cardiovascular: negative for chest pain Respiratory: negative for SOB or hemoptysis Gastrointestinal: negative for abdominal pain Genitourinary: negative for dysuria or gross hematuria Current Meds  Medication Sig  . darifenacin (ENABLEX) 7.5 MG 24 hr tablet Take 1 tablet (7.5 mg total) by mouth daily.  . Multiple Vitamin (MULTIVITAMIN WITH MINERALS) TABS tablet Take 1 tablet by mouth daily.  . Vitamins/Minerals TABS Take by mouth.    Objective  Vitals: BP 120/78 (BP Location: Left Arm, Patient Position: Sitting, Cuff Size: Normal)   Pulse 76   Temp 98.9 F (37.2 C) (Oral)   Wt 165 lb (74.8 kg)   SpO2 98%   BMI 27.46 kg/m  General: no acute distress  Psych:  Alert and oriented, normal mood and affect HEENT:  Normocephalic, atraumatic, supple neck, moist mucous membranes, mildly erythematous pharynx without exudate, mild lymphadenopathy, supple neck Cardiovascular:  RRR without  murmur. no edema Respiratory:  Good breath sounds bilaterally, CTAB with normal respiratory effort with occasional rhonchi Skin:  Warm, no rashes Neurologic:   Mental status is normal. normal gait Negative rapid flu test  Assessment  1. Acute bacterial bronchitis      Plan  Discussion:  Treat for bacterial bronchitis due to prolonged course and worsening symptoms. Education regarding differences between viral and bacterial infections and treatment options are discussed.  Supportive care measures are recommended.  We discussed the use of mucolytic's, decongestants, antihistamines and antitussives as needed.  Tylenol or Advil are recommended if needed.  Follow up: Return if symptoms worsen or fail to improve.   Commons side effects, risks, benefits, and alternatives for medications and treatment plan prescribed today were discussed, and the patient expressed understanding of the given instructions. Patient is instructed to call or message via MyChart if he/she has any questions or concerns regarding our treatment plan. No barriers to understanding were identified. We discussed Red Flag symptoms and signs in detail. Patient expressed understanding regarding what to do in case of urgent or emergency type symptoms.  Medication list was reconciled, printed and provided to the patient in AVS. Patient instructions and summary information was reviewed with the patient as documented in the AVS. This note was prepared with assistance of Dragon voice recognition software. Occasional wrong-word or sound-a-like substitutions may have occurred due to the inherent limitations of voice recognition software  No orders of the defined types were placed in this encounter.  Meds ordered this encounter  Medications  .  azithromycin (ZITHROMAX) 250 MG tablet    Sig: Take 2 tabs today, then 1 tab daily for 4 days    Dispense:  1 each    Refill:  0

## 2017-10-26 ENCOUNTER — Telehealth: Payer: Self-pay | Admitting: *Deleted

## 2017-10-26 ENCOUNTER — Other Ambulatory Visit (INDEPENDENT_AMBULATORY_CARE_PROVIDER_SITE_OTHER): Payer: Managed Care, Other (non HMO) | Admitting: Emergency Medicine

## 2017-10-26 DIAGNOSIS — Z23 Encounter for immunization: Secondary | ICD-10-CM

## 2017-10-26 DIAGNOSIS — Z111 Encounter for screening for respiratory tuberculosis: Secondary | ICD-10-CM

## 2017-10-26 NOTE — Telephone Encounter (Signed)
Orders placed. Patient coming in this afternoon for both.

## 2017-10-26 NOTE — Addendum Note (Signed)
Addended by: Katina Dung on: 10/26/2017 11:08 AM   Modules accepted: Orders

## 2017-10-26 NOTE — Telephone Encounter (Signed)
Patient needing the TB Quantiferon for job.   Okay to order/schedule?     Copied from [place] [id number]. Topic: General - Other >> Oct 26, 2017 10:06 AM Carolyn Stare wrote:  Pt call to say she need to have a TB blood test for her job. Please put in order and contact pt at [phone]

## 2017-10-26 NOTE — Telephone Encounter (Signed)
[place] for Tdap and quantiferon (TB screening)

## 2017-10-26 NOTE — Telephone Encounter (Signed)
Patient also requesting to update TDAP at this appointment

## 2017-10-27 LAB — TIQ-MISC

## 2017-10-28 LAB — QUANTIFERON-TB GOLD PLUS
Mitogen-NIL: >10 IU/mL
NIL: 0.03 IU/mL
QUANTIFERON-TB GOLD PLUS: NEGATIVE
TB1-NIL: 0 [IU]/mL
TB2-NIL: 0.01 [IU]/mL

## 2017-11-03 ENCOUNTER — Other Ambulatory Visit: Payer: Managed Care, Other (non HMO)

## 2018-03-06 ENCOUNTER — Other Ambulatory Visit: Payer: Self-pay | Admitting: Family Medicine

## 2018-03-06 DIAGNOSIS — N3281 Overactive bladder: Secondary | ICD-10-CM

## 2018-05-24 ENCOUNTER — Encounter: Payer: Managed Care, Other (non HMO) | Admitting: Family Medicine

## 2018-09-08 ENCOUNTER — Other Ambulatory Visit: Payer: Self-pay | Admitting: Family Medicine

## 2018-09-08 DIAGNOSIS — N3281 Overactive bladder: Secondary | ICD-10-CM

## 2018-09-08 NOTE — Telephone Encounter (Signed)
Please advise pt did not have a CPE in 2019 and does not have any upcoming appts.

## 2018-10-12 ENCOUNTER — Encounter: Payer: Self-pay | Admitting: Family Medicine

## 2018-10-12 ENCOUNTER — Other Ambulatory Visit: Payer: Self-pay

## 2018-10-12 ENCOUNTER — Ambulatory Visit (INDEPENDENT_AMBULATORY_CARE_PROVIDER_SITE_OTHER): Payer: Managed Care, Other (non HMO) | Admitting: Family Medicine

## 2018-10-12 VITALS — BP 121/78 | HR 76 | Temp 98.0°F | Resp 16 | Ht 65.0 in | Wt 172.5 lb

## 2018-10-12 DIAGNOSIS — E663 Overweight: Secondary | ICD-10-CM | POA: Diagnosis not present

## 2018-10-12 DIAGNOSIS — Z0000 Encounter for general adult medical examination without abnormal findings: Secondary | ICD-10-CM

## 2018-10-12 DIAGNOSIS — Z111 Encounter for screening for respiratory tuberculosis: Secondary | ICD-10-CM | POA: Diagnosis not present

## 2018-10-12 LAB — CBC WITH DIFFERENTIAL/PLATELET
Basophils Absolute: 0 10*3/uL (ref 0.0–0.1)
Basophils Relative: 0.6 % (ref 0.0–3.0)
Eosinophils Absolute: 0.1 10*3/uL (ref 0.0–0.7)
Eosinophils Relative: 1.9 % (ref 0.0–5.0)
HCT: 46.9 % — ABNORMAL HIGH (ref 36.0–46.0)
Hemoglobin: 16.1 g/dL — ABNORMAL HIGH (ref 12.0–15.0)
Lymphocytes Relative: 38.3 % (ref 12.0–46.0)
Lymphs Abs: 1.4 10*3/uL (ref 0.7–4.0)
MCHC: 34.3 g/dL (ref 30.0–36.0)
MCV: 88.2 fl (ref 78.0–100.0)
Monocytes Absolute: 0.4 10*3/uL (ref 0.1–1.0)
Monocytes Relative: 11.8 % (ref 3.0–12.0)
NEUTROS ABS: 1.8 10*3/uL (ref 1.4–7.7)
Neutrophils Relative %: 47.4 % (ref 43.0–77.0)
Platelets: 178 10*3/uL (ref 150.0–400.0)
RBC: 5.32 Mil/uL — ABNORMAL HIGH (ref 3.87–5.11)
RDW: 13.2 % (ref 11.5–15.5)
WBC: 3.7 10*3/uL — ABNORMAL LOW (ref 4.0–10.5)

## 2018-10-12 LAB — HEPATIC FUNCTION PANEL
ALT: 17 U/L (ref 0–35)
AST: 16 U/L (ref 0–37)
Albumin: 4.7 g/dL (ref 3.5–5.2)
Alkaline Phosphatase: 42 U/L (ref 39–117)
BILIRUBIN TOTAL: 0.6 mg/dL (ref 0.2–1.2)
Bilirubin, Direct: 0.1 mg/dL (ref 0.0–0.3)
Total Protein: 7.2 g/dL (ref 6.0–8.3)

## 2018-10-12 LAB — LIPID PANEL
Cholesterol: 193 mg/dL (ref 0–200)
HDL: 61.2 mg/dL
LDL Cholesterol: 121 mg/dL — ABNORMAL HIGH (ref 0–99)
NonHDL: 131.68
Total CHOL/HDL Ratio: 3
Triglycerides: 52 mg/dL (ref 0.0–149.0)
VLDL: 10.4 mg/dL (ref 0.0–40.0)

## 2018-10-12 LAB — BASIC METABOLIC PANEL WITH GFR
BUN: 21 mg/dL (ref 6–23)
CO2: 29 meq/L (ref 19–32)
Calcium: 9.5 mg/dL (ref 8.4–10.5)
Chloride: 104 meq/L (ref 96–112)
Creatinine, Ser: 0.92 mg/dL (ref 0.40–1.20)
GFR: 65.29 mL/min
Glucose, Bld: 95 mg/dL (ref 70–99)
Potassium: 4.9 meq/L (ref 3.5–5.1)
Sodium: 139 meq/L (ref 135–145)

## 2018-10-12 LAB — TSH: TSH: 3.36 u[IU]/mL (ref 0.35–4.50)

## 2018-10-12 NOTE — Progress Notes (Signed)
   Subjective:    Patient ID: Kristi Dillon, female    DOB: 1970/12/21, 48 y.o.   MRN: [id number]  HPI CPE- due for pap and mammo (pt wants to find new GYN).  UTD on flu and Tdap.  Pt has gained 8 lbs since last visit.   Review of Systems Patient reports no vision/ hearing changes, adenopathy,fever, weight change,  persistant/recurrent hoarseness , swallowing issues, chest pain, palpitations, edema, persistant/recurrent cough, hemoptysis, dyspnea (rest/exertional/paroxysmal nocturnal), gastrointestinal bleeding (melena, rectal bleeding), abdominal pain, significant heartburn, bowel changes, GU symptoms (dysuria, hematuria, incontinence), Gyn symptoms (abnormal  bleeding, pain),  syncope, focal weakness, memory loss, numbness & tingling, skin/hair/nail changes, abnormal bruising or bleeding, anxiety, or depression.     Objective:   Physical Exam General Appearance:    Alert, cooperative, no distress, appears stated age  Head:    Normocephalic, without obvious abnormality, atraumatic  Eyes:    PERRL, conjunctiva/corneas clear, EOM's intact, fundi    benign, both eyes  Ears:    Normal TM's and external ear canals, both ears  Nose:   Nares normal, septum midline, mucosa normal, no drainage    or sinus tenderness  Throat:   Lips, mucosa, and tongue normal; teeth and gums normal  Neck:   Supple, symmetrical, trachea midline, no adenopathy;    Thyroid: no enlargement/tenderness/nodules  Back:     Symmetric, no curvature, ROM normal, no CVA tenderness  Lungs:     Clear to auscultation bilaterally, respirations unlabored  Chest Wall:    No tenderness or deformity   Heart:    Regular rate and rhythm, S1 and S2 normal, no murmur, rub   or gallop  Breast Exam:    Deferred to GYN  Abdomen:     Soft, non-tender, bowel sounds active all four quadrants,    no masses, no organomegaly  Genitalia:    Deferred to GYN  Rectal:    Extremities:   Extremities normal, atraumatic, no cyanosis or edema    Pulses:   2+ and symmetric all extremities  Skin:   Skin color, texture, turgor normal, no rashes or lesions  Lymph nodes:   Cervical, supraclavicular, and axillary nodes normal  Neurologic:   CNII-XII intact, normal strength, sensation and reflexes    throughout          Assessment & Plan:

## 2018-10-12 NOTE — Assessment & Plan Note (Signed)
Pt has gained 8 lbs since last visit.  Stressed need for healthy diet and regular exercise.  Check labs to risk stratify.  Will follow. °

## 2018-10-12 NOTE — Patient Instructions (Signed)
Follow up in 1 year or as needed We'll notify you of your lab results and make any changes if needed Continue to work on healthy diet and regular exercise- you can do it! Call and schedule w/ Dr Helane Rima at [facility] [phone] Call with any questions or concerns Happy Spring!!!

## 2018-10-12 NOTE — Assessment & Plan Note (Signed)
Pt's PE WNL w/ exception of being overweight.  She is overdue on pap and mammo- she is to call GYN.  UTD on immunizations.  Check labs.  Anticipatory guidance provided.

## 2018-10-12 NOTE — Addendum Note (Signed)
Addended by: Doran Clay A on: 10/12/2018 08:52 AM   Modules accepted: Orders

## 2018-10-14 ENCOUNTER — Other Ambulatory Visit: Payer: Self-pay | Admitting: Family Medicine

## 2018-10-14 DIAGNOSIS — D582 Other hemoglobinopathies: Secondary | ICD-10-CM

## 2018-10-14 LAB — QUANTIFERON-TB GOLD PLUS
Mitogen-NIL: 6.16 IU/mL
NIL: 0.44 IU/mL
QuantiFERON-TB Gold Plus: NEGATIVE
TB1-NIL: 0.05 IU/mL
TB2-NIL: 0.07 IU/mL

## 2019-03-04 ENCOUNTER — Other Ambulatory Visit: Payer: Self-pay

## 2019-03-04 ENCOUNTER — Ambulatory Visit (INDEPENDENT_AMBULATORY_CARE_PROVIDER_SITE_OTHER): Payer: Managed Care, Other (non HMO) | Admitting: Physician Assistant

## 2019-03-04 ENCOUNTER — Encounter: Payer: Self-pay | Admitting: Physician Assistant

## 2019-03-04 DIAGNOSIS — H109 Unspecified conjunctivitis: Secondary | ICD-10-CM

## 2019-03-04 MED ORDER — POLYMYXIN B-TRIMETHOPRIM 10000-0.1 UNIT/ML-% OP SOLN: OPHTHALMIC | 0 refills | Status: DC

## 2019-03-04 NOTE — Progress Notes (Signed)
   Virtual Visit via Video   I connected with patient on 03/04/19 at 11:30 AM EDT by a video enabled telemedicine application and verified that I am speaking with the correct person using two identifiers.  Location patient: Home Location provider: [facility], Office Persons participating in the virtual visit: Patient, Provider, [place] (Patina Moore)  I discussed the limitations of evaluation and management by telemedicine and the availability of in person appointments. The patient expressed understanding and agreed to proceed.  Subjective:   HPI:   Patient presents via [facility] today c/o potential conjunctivitis. Notes a gritty sensation in the left eye and then the red eye. Took contacts out and noted pink/redness of the conjunctiva with L > R. Woke up this morning with matted eyes. Is having drainage from both eyes. Denies eye vision, significant eye pain. Denies URI symptoms, fever, chills.    ROS:   See pertinent positives and negatives per HPI.  Patient Active Problem List   Diagnosis Date Noted  . Overweight (BMI 25.0-29.9) 10/12/2018  . Physical exam 05/22/2017  . Urinary frequency 12/10/2016    Social History   Tobacco Use  . Smoking status: Never Smoker  . Smokeless tobacco: Never Used  Substance Use Topics  . Alcohol use: Yes    Comment: 2-3 a week    Current Outpatient Medications:  Marland Kitchen  Multiple Vitamin (MULTIVITAMIN WITH MINERALS) TABS tablet, Take 1 tablet by mouth daily., Disp: , Rfl:   Allergies  Allergen Reactions  . Prednisone Rash    Objective:   There were no vitals taken for this visit.  Patient is well-developed, well-nourished in no acute distress.  Resting comfortably at home.  Head is normocephalic, atraumatic.  No labored breathing.  Speech is clear and coherent with logical contest.  Patient is alert and oriented at baseline.  Injection of conjunctiva noted bilaterally without evidence of lid swelling. No active drainage that I can  see on limited examination.  Assessment and Plan:    1. Bacterial conjunctivitis of both eyes Rx Polytrim Op. Supportive measures reviewed. Follow-up if not resolving.     Leeanne Rio, PA-C 03/04/2019

## 2019-03-04 NOTE — Progress Notes (Signed)
I have discussed the procedure for the virtual visit with the patient who has given consent to proceed with assessment and treatment.   Kardell Virgil S Malavika Lira, CMA     

## 2019-03-04 NOTE — Patient Instructions (Signed)
Instructions sent to MyChart.    Bacterial Conjunctivitis, Adult Bacterial conjunctivitis is an infection of your conjunctiva. This is the clear membrane that covers the white part of your eye and the inner part of your eyelid. This infection can make your eye:  Red or pink.  Itchy. This condition spreads easily from person to person (is contagious) and from one eye to the other eye. What are the causes?  This condition is caused by germs (bacteria). You may get the infection if you come into close contact with: ? A person who has the infection. ? Items that have germs on them (are contaminated), such as face towels, contact lens solution, or eye makeup. What increases the risk? You are more likely to get this condition if you:  Have contact with people who have the infection.  Wear contact lenses.  Have a sinus infection.  Have had a recent eye injury or surgery.  Have a weak body defense system (immune system).  Have dry eyes. What are the signs or symptoms?   Thick, yellowish discharge from the eye.  Tearing or watery eyes.  Itchy eyes.  Burning feeling in your eyes.  Eye redness.  Swollen eyelids.  Blurred vision. How is this treated?   Antibiotic eye drops or ointment.  Antibiotic medicine taken by mouth. This is used for infections that do not get better with drops or ointment or that last more than 10 days.  Cool, wet cloths placed on the eyes.  Artificial tears used 2-6 times a day. Follow these instructions at home: Medicines  Take or apply your antibiotic medicine as told by your doctor. Do not stop taking or applying the antibiotic even if you start to feel better.  Take or apply over-the-counter and prescription medicines only as told by your doctor.  Do not touch your eyelid with the eye-drop bottle or the ointment tube. Managing discomfort  Wipe any fluid from your eye with a warm, wet washcloth or a cotton ball.  Place a clean, cool,  wet cloth on your eye. Do this for 10-20 minutes, 3-4 times per day. General instructions  Do not wear contacts until the infection is gone. Wear glasses until your doctor says it is okay to wear contacts again.  Do not wear eye makeup until the infection is gone. Throw away old eye makeup.  Change or wash your pillowcase every day.  Do not share towels or washcloths.  Wash your hands often with soap and water. Use paper towels to dry your hands.  Do not touch or rub your eyes.  Do not drive or use heavy machinery if your vision is blurred. Contact a doctor if:  You have a fever.  You do not get better after 10 days. Get help right away if:  You have a fever and your symptoms get worse all of a sudden.  You have very bad pain when you move your eye.  Your face: ? Hurts. ? Is red. ? Is swollen.  You have sudden loss of vision. Summary  Bacterial conjunctivitis is an infection of your conjunctiva.  This infection spreads easily from person to person.  Wash your hands often with soap and water. Use paper towels to dry your hands.  Take or apply your antibiotic medicine as told by your doctor.  Contact a doctor if you have a fever or you do not get better after 10 days. This information is not intended to replace advice given to you by your health  care provider. Make sure you discuss any questions you have with your health care provider. Document Released: 05/20/2008 Document Revised: 11/30/2018 Document Reviewed: 03/17/2018 Elsevier Patient Education  2020 [employer].

## 2019-03-30 ENCOUNTER — Ambulatory Visit (INDEPENDENT_AMBULATORY_CARE_PROVIDER_SITE_OTHER): Payer: Managed Care, Other (non HMO) | Admitting: Physician Assistant

## 2019-03-30 ENCOUNTER — Encounter: Payer: Self-pay | Admitting: Physician Assistant

## 2019-03-30 ENCOUNTER — Other Ambulatory Visit: Payer: Self-pay

## 2019-03-30 VITALS — BP 104/80 | HR 69 | Temp 99.3°F | Resp 14 | Ht 65.0 in | Wt 168.0 lb

## 2019-03-30 DIAGNOSIS — S39012A Strain of muscle, fascia and tendon of lower back, initial encounter: Secondary | ICD-10-CM

## 2019-03-30 MED ORDER — MELOXICAM 15 MG PO TABS: 15.0000 mg | ORAL_TABLET | Freq: Every day | ORAL | 0 refills | Status: DC

## 2019-03-30 MED ORDER — CYCLOBENZAPRINE HCL 5 MG PO TABS: 5.0000 mg | ORAL_TABLET | Freq: Every day | ORAL | 0 refills | Status: DC

## 2019-03-30 NOTE — Patient Instructions (Signed)
Avoid any heavy lifting or overexertion. Continue stretching and walking daily. Apply heating pad to the lower back for 10-15 minutes, a few times per day. Use the Flexeril at night. Meloxicam once daily with food over the next week. If things are not continuing to resolve, please let us know as we would like to send you for a Physical Therapy assessment.

## 2019-03-30 NOTE — Progress Notes (Signed)
Patient presents to clinic today c/o intermittent lower back pain x  4 weeks. Endorses falling down flight of stairs a month ago. Notes her foot slipped out from under her. No dizziness, lightheadedness before or after fall. Denies head trauma or injury, LOC. Was able to getup on her on. Walked right after to help reduce soreness. Notes initially having more constant pain in lower back, bileral, non-radiating x 4 days. Is still dealing with "catch" in her R lower back that occurs when trying to stand from seated or lying position. Notes she is working from home and is more sedentary than usual but tries to keep active. Is walking regularly for exercise but has not restarted running/jogging. Denies any weakness, numbness or tingling of hips and lower extremities. Notes some "knotting" of lower back. Did take Tylenol for a couple of days. Nothing since then. Has been getting some massage from husband which helps.   Past Medical History:  Diagnosis Date  . Allergy   . Asthma   . History of chicken pox   . History of chronic bronchitis   . History of Mayer-Rokitansky-Kster-Hauser syndrome type II   . History of urinary tract infection     Current Outpatient Medications on File Prior to Visit  Medication Sig Dispense Refill  . Multiple Vitamin (MULTIVITAMIN WITH MINERALS) TABS tablet Take 1 tablet by mouth daily.     No current facility-administered medications on file prior to visit.     Allergies  Allergen Reactions  . Prednisone Rash    Family History  Problem Relation Age of Onset  . Hyperlipidemia Father   . Arthritis Father   . Heart disease Father   . Alzheimer's disease Father   . Cancer Maternal Grandmother        Skin  . Arthritis Maternal Grandmother   . Alzheimer's disease Maternal Grandmother   . Arthritis Paternal Grandmother   . Cancer Paternal Grandmother        breast  . Cancer Paternal Grandfather        Stomach  . Arthritis Paternal Grandfather   . Alcohol  abuse Paternal Uncle   . Arthritis Maternal Grandfather   . Cancer Paternal Uncle        lung    Social History   Socioeconomic History  . Marital status: Married    Spouse name: Not on file  . Number of children: Not on file  . Years of education: Not on file  . Highest education level: Not on file  Occupational History  . Not on file  Social Needs  . Financial resource strain: Not on file  . Food insecurity    Worry: Not on file    Inability: Not on file  . Transportation needs    Medical: Not on file    Non-medical: Not on file  Tobacco Use  . Smoking status: Never Smoker  . Smokeless tobacco: Never Used  Substance and Sexual Activity  . Alcohol use: Yes    Comment: 2-3 a week  . Drug use: No  . Sexual activity: Not on file  Lifestyle  . Physical activity    Days per week: Not on file    Minutes per session: Not on file  . Stress: Not on file  Relationships  . Social Herbalist on phone: Not on file    Gets together: Not on file    Attends religious service: Not on file    Active member of club or  organization: Not on file    Attends meetings of clubs or organizations: Not on file    Relationship status: Not on file  Other Topics Concern  . Not on file  Social History Narrative  . Not on file   Review of Systems - See HPI.  All other ROS are negative.  BP 104/80   Pulse 69   Temp 99.3 F (37.4 C) (Skin)   Resp 14   Ht 5\' 5"  (1.651 m)   Wt 168 lb (76.2 kg)   SpO2 99%   BMI 27.96 kg/m   Physical Exam Vitals signs reviewed.  Constitutional:      Appearance: Normal appearance.  HENT:     Head: Normocephalic and atraumatic.  Cardiovascular:     Rate and Rhythm: Normal rate.  Pulmonary:     Effort: Pulmonary effort is normal.  Musculoskeletal:     Lumbar back: She exhibits tenderness, pain and spasm. She exhibits normal range of motion, no bony tenderness, no swelling and no edema.  Neurological:     Mental Status: She is alert.     Assessment/Plan: 1. Strain of lumbar region, initial encounter No bony tenderness to palpation or percussion. No bony abnormality. Normal ROM but pain in R perispinous musculature of Lumbar spine with lateral flexion, extension and rotation. Some spasm noted. Start Meloxicam once daily w food. Flexeril 5 mg at night. Supportive measures reviewed. If not continuing to resolve would recommend PT assessment.   - meloxicam (MOBIC) 15 MG tablet; Take 1 tablet (15 mg total) by mouth daily.  Dispense: 30 tablet; Refill: 0 - cyclobenzaprine (FLEXERIL) 5 MG tablet; Take 1 tablet (5 mg total) by mouth at bedtime.  Dispense: 15 tablet; Refill: 0   Leeanne Rio, PA-C

## 2019-04-21 ENCOUNTER — Encounter: Payer: Self-pay | Admitting: Family Medicine

## 2019-04-23 ENCOUNTER — Other Ambulatory Visit: Payer: Self-pay | Admitting: Physician Assistant

## 2019-04-23 DIAGNOSIS — S39012A Strain of muscle, fascia and tendon of lower back, initial encounter: Secondary | ICD-10-CM

## 2019-10-04 ENCOUNTER — Telehealth: Payer: Self-pay

## 2019-10-04 NOTE — Telephone Encounter (Signed)
Patient requesting to have Quantiferon drawn for work. Please advise if OK to schedule nurse visit.

## 2019-10-05 ENCOUNTER — Other Ambulatory Visit: Payer: Self-pay

## 2019-10-05 DIAGNOSIS — Z111 Encounter for screening for respiratory tuberculosis: Secondary | ICD-10-CM

## 2019-10-05 NOTE — Telephone Encounter (Signed)
Patient has been scheduled and labs ordered future.

## 2019-10-05 NOTE — Telephone Encounter (Signed)
[place] for [facility], dx TB screen

## 2019-10-06 ENCOUNTER — Ambulatory Visit (INDEPENDENT_AMBULATORY_CARE_PROVIDER_SITE_OTHER): Payer: Managed Care, Other (non HMO) | Admitting: *Deleted

## 2019-10-06 ENCOUNTER — Other Ambulatory Visit: Payer: Self-pay

## 2019-10-06 DIAGNOSIS — Z111 Encounter for screening for respiratory tuberculosis: Secondary | ICD-10-CM | POA: Diagnosis not present

## 2019-10-09 LAB — QUANTIFERON-TB GOLD PLUS
Mitogen-NIL: >10 IU/mL
NIL: 0.24 IU/mL
QuantiFERON-TB Gold Plus: NEGATIVE
TB1-NIL: <0 IU/mL
TB2-NIL: 0.03 IU/mL

## 2019-10-10 ENCOUNTER — Encounter: Payer: Self-pay | Admitting: General Practice

## 2019-10-14 ENCOUNTER — Encounter: Payer: Managed Care, Other (non HMO) | Admitting: Family Medicine

## 2019-12-19 ENCOUNTER — Telehealth: Payer: Self-pay

## 2019-12-19 ENCOUNTER — Ambulatory Visit (INDEPENDENT_AMBULATORY_CARE_PROVIDER_SITE_OTHER): Payer: Managed Care, Other (non HMO) | Admitting: Family Medicine

## 2019-12-19 ENCOUNTER — Encounter: Payer: Self-pay | Admitting: Family Medicine

## 2019-12-19 ENCOUNTER — Other Ambulatory Visit: Payer: Self-pay

## 2019-12-19 VITALS — BP 112/72 | HR 77 | Temp 97.2°F | Resp 15 | Ht 65.0 in | Wt 176.6 lb

## 2019-12-19 DIAGNOSIS — B029 Zoster without complications: Secondary | ICD-10-CM

## 2019-12-19 NOTE — Telephone Encounter (Signed)
Pt is aware.  

## 2019-12-19 NOTE — Progress Notes (Signed)
   Subjective:    Patient ID: Neriya Meckstroth, female    DOB: March 17, 1971, 49 y.o.   MRN: [id number]  HPI Forehead lesion- sxs started 8 days ago w/ 3-4 bumps 'right in a row'.  Recently completed abx for peri-oral dermatitis.  Denies itching or burning.  Pt reports there was swelling surrounding the bumps and then the bumps went away.  This weekend when running errands developed 'twinges'.  Has been applying vasoline.  No known bite or injury.   Review of Systems For ROS see HPI   This visit occurred during the SARS-CoV-2 public health emergency.  Safety protocols were in place, including screening questions prior to the visit, additional usage of staff PPE, and extensive cleaning of exam room while observing appropriate contact time as indicated for disinfecting solutions.       Objective:   Physical Exam Vitals reviewed.  Constitutional:      General: She is not in acute distress.    Appearance: Normal appearance. She is not ill-appearing.  HENT:     Head: Normocephalic and atraumatic.  Eyes:     Extraocular Movements: Extraocular movements intact.     Conjunctiva/sclera: Conjunctivae normal.     Pupils: Pupils are equal, round, and reactive to light.  Skin:    General: Skin is warm and dry.     Findings: Rash (vertical line of scabbed vesicles just L of forehead center w/o surrounding redness, induration, or drainage) present.  Neurological:     General: No focal deficit present.     Mental Status: She is alert and oriented to person, place, and time.  Psychiatric:        Mood and Affect: Mood normal.        Behavior: Behavior normal.        Thought Content: Thought content normal.           Assessment & Plan:  Shingles- new.  She is outside the treatment window at this time.  Discussed dx and supportive care.  Also discussed precautions when seeing patients.  Pt expressed understanding and is in agreement w/ plan.

## 2019-12-19 NOTE — Telephone Encounter (Signed)
[place] for pt to come into office

## 2019-12-19 NOTE — Telephone Encounter (Signed)
Patient is scheduled for virtual appt this morning. Patient would like to know come in to the office. Patient is not experiencing any covid symptoms. Patient states that last weekend she woke up with 3 bumps on her forehead. Patient states that she  have a spot on her forehead now and it seems to be swelling.

## 2019-12-19 NOTE — Patient Instructions (Signed)
Follow up as needed or as scheduled This appears to be shingles and was likely stress induced You are fine to see patients as long as it is covered and you are not around any non-immune pregnant women Call with any questions or concerns Hang in there!

## 2020-10-02 ENCOUNTER — Telehealth: Payer: Self-pay | Admitting: Family Medicine

## 2020-10-02 ENCOUNTER — Telehealth: Payer: Self-pay

## 2020-10-02 NOTE — Telephone Encounter (Signed)
Called patient to get her scheduled for a lab only visit for a quantiferon TB test and pt did not answer. Left vm to return call to office to schedule.

## 2020-10-02 NOTE — Telephone Encounter (Signed)
Called to address concerns. Left vm to return call to office.

## 2020-10-02 NOTE — Telephone Encounter (Signed)
Pt called in asking if she can do a lab only appt for a quantiferon tb test. She needs this for work.  Please advise   Her last appt was April 2021

## 2020-10-03 ENCOUNTER — Other Ambulatory Visit: Payer: Managed Care, Other (non HMO)

## 2020-10-03 NOTE — Telephone Encounter (Signed)
Pt has been scheduled for a lab only appt.

## 2020-10-04 ENCOUNTER — Other Ambulatory Visit (INDEPENDENT_AMBULATORY_CARE_PROVIDER_SITE_OTHER): Payer: Managed Care, Other (non HMO)

## 2020-10-04 ENCOUNTER — Other Ambulatory Visit: Payer: Self-pay

## 2020-10-04 DIAGNOSIS — Z020 Encounter for examination for admission to educational institution: Secondary | ICD-10-CM

## 2020-10-06 LAB — QUANTIFERON-TB GOLD PLUS
Mitogen-NIL: >10 IU/mL
NIL: 0.07 IU/mL
QuantiFERON-TB Gold Plus: NEGATIVE
TB1-NIL: 0 IU/mL
TB2-NIL: 0.02 IU/mL

## 2020-10-08 NOTE — Progress Notes (Signed)
Results reviewed via Mychart.

## 2020-12-21 ENCOUNTER — Other Ambulatory Visit: Payer: Self-pay

## 2020-12-21 ENCOUNTER — Encounter: Payer: Self-pay | Admitting: Family Medicine

## 2020-12-21 ENCOUNTER — Ambulatory Visit (INDEPENDENT_AMBULATORY_CARE_PROVIDER_SITE_OTHER): Payer: Managed Care, Other (non HMO) | Admitting: Family Medicine

## 2020-12-21 VITALS — BP 124/80 | HR 61 | Temp 98.5°F | Resp 18 | Ht 64.5 in | Wt 167.0 lb

## 2020-12-21 DIAGNOSIS — E663 Overweight: Secondary | ICD-10-CM

## 2020-12-21 DIAGNOSIS — R319 Hematuria, unspecified: Secondary | ICD-10-CM | POA: Diagnosis not present

## 2020-12-21 DIAGNOSIS — Z0000 Encounter for general adult medical examination without abnormal findings: Secondary | ICD-10-CM | POA: Diagnosis not present

## 2020-12-21 DIAGNOSIS — Z1211 Encounter for screening for malignant neoplasm of colon: Secondary | ICD-10-CM | POA: Diagnosis not present

## 2020-12-21 LAB — HEPATIC FUNCTION PANEL
ALT: 15 U/L (ref 0–35)
AST: 18 U/L (ref 0–37)
Albumin: 5.1 g/dL (ref 3.5–5.2)
Alkaline Phosphatase: 72 U/L (ref 39–117)
Bilirubin, Direct: 0.1 mg/dL (ref 0.0–0.3)
Total Bilirubin: 0.7 mg/dL (ref 0.2–1.2)
Total Protein: 7.7 g/dL (ref 6.0–8.3)

## 2020-12-21 LAB — BASIC METABOLIC PANEL
BUN: 17 mg/dL (ref 6–23)
CO2: 30 mEq/L (ref 19–32)
Calcium: 10.1 mg/dL (ref 8.4–10.5)
Chloride: 102 mEq/L (ref 96–112)
Creatinine, Ser: 0.91 mg/dL (ref 0.40–1.20)
GFR: 73.98 mL/min (ref >60.00)
Glucose, Bld: 86 mg/dL (ref 70–99)
Potassium: 4 mEq/L (ref 3.5–5.1)
Sodium: 140 mEq/L (ref 135–145)

## 2020-12-21 LAB — LIPID PANEL
Cholesterol: 213 mg/dL — ABNORMAL HIGH (ref 0–200)
HDL: 62.6 mg/dL (ref >39.00)
LDL Cholesterol: 138 mg/dL — ABNORMAL HIGH (ref 0–99)
NonHDL: 150.88
Total CHOL/HDL Ratio: 3
Triglycerides: 63 mg/dL (ref 0.0–149.0)
VLDL: 12.6 mg/dL (ref 0.0–40.0)

## 2020-12-21 LAB — CBC WITH DIFFERENTIAL/PLATELET
Basophils Absolute: 0 10*3/uL (ref 0.0–0.1)
Basophils Relative: 0.5 % (ref 0.0–3.0)
Eosinophils Absolute: 0.1 10*3/uL (ref 0.0–0.7)
Eosinophils Relative: 1.2 % (ref 0.0–5.0)
HCT: 45.7 % (ref 36.0–46.0)
Hemoglobin: 15.8 g/dL — ABNORMAL HIGH (ref 12.0–15.0)
Lymphocytes Relative: 48.2 % — ABNORMAL HIGH (ref 12.0–46.0)
Lymphs Abs: 2.1 10*3/uL (ref 0.7–4.0)
MCHC: 34.5 g/dL (ref 30.0–36.0)
MCV: 86.2 fl (ref 78.0–100.0)
Monocytes Absolute: 0.3 10*3/uL (ref 0.1–1.0)
Monocytes Relative: 7 % (ref 3.0–12.0)
Neutro Abs: 1.9 10*3/uL (ref 1.4–7.7)
Neutrophils Relative %: 43.1 % (ref 43.0–77.0)
Platelets: 197 10*3/uL (ref 150.0–400.0)
RBC: 5.3 Mil/uL — ABNORMAL HIGH (ref 3.87–5.11)
RDW: 13.4 % (ref 11.5–15.5)
WBC: 4.3 10*3/uL (ref 4.0–10.5)

## 2020-12-21 LAB — TSH: TSH: 2.61 u[IU]/mL (ref 0.35–4.50)

## 2020-12-21 NOTE — Progress Notes (Signed)
   Subjective:    Patient ID: Kristi Dillon, female    DOB: 09-08-1970, 50 y.o.   MRN: [id number]  HPI CPE- due for colonoscopy, Tdap, COVID, flu.  Due for pap (scheduled), colonoscopy.  UTD on mammo  Reviewed past medical, surgical, family and social histories.   Patient Care Team    Relationship Specialty Notifications Start End  Midge Minium, MD PCP - General Family Medicine  05/19/16   Linward Natal, MD Referring Physician Obstetrics and Gynecology  05/16/16     Health Maintenance  Topic Date Due  . COLONOSCOPY (Pts 45-82yrs Insurance coverage will need to be confirmed)  Never done  . PAP SMEAR-Modifier  05/19/2018  . Hepatitis C Screening  12/21/2021 (Originally 07/28/1971)  . HIV Screening  12/21/2021 (Originally 04/08/1986)  . INFLUENZA VACCINE  03/25/2021  . MAMMOGRAM  12/20/2021  . TETANUS/TDAP  10/27/2027  . COVID-19 Vaccine  Completed  . HPV VACCINES  Aged Out      Review of Systems Patient reports no vision/ hearing changes, adenopathy,fever, persistant/recurrent hoarseness , swallowing issues, chest pain, palpitations, edema, persistant/recurrent cough, hemoptysis, dyspnea (rest/exertional/paroxysmal nocturnal), gastrointestinal bleeding (melena, rectal bleeding), abdominal pain, significant heartburn, bowel changes, Gyn symptoms (abnormal  bleeding, pain),  syncope, focal weakness, memory loss, numbness & tingling, skin/hair/nail changes, abnormal bruising or bleeding, anxiety, or depression.   + weight loss 10 lbs + hematuria, cloudy urine, urgency- went to [facility], started on Bactrim x3 days.  sxs have resolved but they told her it was not UTI and she needed to f/u  This visit occurred during the SARS-CoV-2 public health emergency.  Safety protocols were in place, including screening questions prior to the visit, additional usage of staff PPE, and extensive cleaning of exam room while observing appropriate contact time as indicated for disinfecting  solutions.       Objective:   Physical Exam General Appearance:    Alert, cooperative, no distress, appears stated age  Head:    Normocephalic, without obvious abnormality, atraumatic  Eyes:    PERRL, conjunctiva/corneas clear, EOM's intact, fundi    benign, both eyes  Ears:    Normal TM's and external ear canals, both ears  Nose:   Deferred due to COVID  Throat:   Neck:   Supple, symmetrical, trachea midline, no adenopathy;    Thyroid: no enlargement/tenderness/nodules  Back:     Symmetric, no curvature, ROM normal, no CVA tenderness  Lungs:     Clear to auscultation bilaterally, respirations unlabored  Chest Wall:    No tenderness or deformity   Heart:    Regular rate and rhythm, S1 and S2 normal, no murmur, rub   or gallop  Breast Exam:    Deferred to GYN  Abdomen:     Soft, non-tender, bowel sounds active all four quadrants,    no masses, no organomegaly  Genitalia:    Deferred to GYN  Rectal:    Extremities:   Extremities normal, atraumatic, no cyanosis or edema  Pulses:   2+ and symmetric all extremities  Skin:   Skin color, texture, turgor normal, no rashes or lesions  Lymph nodes:   Cervical, supraclavicular, and axillary nodes normal  Neurologic:   CNII-XII intact, normal strength, sensation and reflexes    throughout          Assessment & Plan:

## 2020-12-21 NOTE — Assessment & Plan Note (Signed)
Pt's PE WNL.  UTD on mammo, immunizations.  Pap scheduled next week.  Referral for colonoscopy placed.  Check labs.  Anticipatory guidance provided.

## 2020-12-21 NOTE — Assessment & Plan Note (Signed)
Pt is down 10 lbs since last visit!  Applauded her efforts and encouraged her to continue.  Check labs to risk stratify.  Will follow.

## 2020-12-21 NOTE — Patient Instructions (Signed)
Follow up in 1 year or as needed We'll notify you of your lab results and make any changes if needed Keep up the good work on healthy diet and regular exercise- you're doing great! Have them send me a copy of your pap We'll call you with your GI appt Call with any questions or concerns Happy Mother's Day!

## 2020-12-23 LAB — URINE CULTURE
MICRO NUMBER:: 11834895
SPECIMEN QUALITY:: ADEQUATE

## 2020-12-24 ENCOUNTER — Encounter: Payer: Self-pay | Admitting: Family Medicine

## 2021-02-20 ENCOUNTER — Encounter: Payer: Self-pay | Admitting: *Deleted

## 2021-04-16 ENCOUNTER — Telehealth (INDEPENDENT_AMBULATORY_CARE_PROVIDER_SITE_OTHER): Payer: Managed Care, Other (non HMO) | Admitting: Family Medicine

## 2021-04-16 ENCOUNTER — Encounter: Payer: Self-pay | Admitting: Family Medicine

## 2021-04-16 VITALS — Wt 152.0 lb

## 2021-04-16 DIAGNOSIS — H5789 Other specified disorders of eye and adnexa: Secondary | ICD-10-CM | POA: Diagnosis not present

## 2021-04-16 DIAGNOSIS — Z8669 Personal history of other diseases of the nervous system and sense organs: Secondary | ICD-10-CM

## 2021-04-16 MED ORDER — ERYTHROMYCIN 5 MG/GM OP OINT: 1.0000 "application " | TOPICAL_OINTMENT | Freq: Three times a day (TID) | OPHTHALMIC | 0 refills | Status: DC

## 2021-04-16 NOTE — Progress Notes (Signed)
Virtual Visit via Video Note  I connected with   on 04/16/21 at  3:00 PM EDT by a video enabled telemedicine application and verified that I am speaking with the correct person using two identifiers.  Location patient: home, [place] Location provider:work or home office Persons participating in the virtual visit: patient, provider  I discussed the limitations of evaluation and management by telemedicine and the availability of in person appointments. The patient expressed understanding and agreed to proceed.   HPI:  Acute telemedicine visit for "pink eye": -Onset: 2-3 days -Symptoms include: drainage and crusting of eyelashes R eye, some mild irritation/scratchiness of the eye -Denies:fevers, resp symptoms, pain, vision changes, swelling or redness around the eye, trauma or foreign body in the eye, known pink eye contacts -Has tried:removed her contacts -Pertinent past medical history: see below -Pertinent medication allergies:  Allergies  Allergen Reactions   Prednisone Rash    ROS: See pertinent positives and negatives per HPI.  Past Medical History:  Diagnosis Date   Allergy    Asthma    History of chicken pox    History of chronic bronchitis    History of Mayer-Rokitansky-Kster-Hauser syndrome type II    History of urinary tract infection     History reviewed. No pertinent surgical history.   Current Outpatient Medications:    cholecalciferol (VITAMIN D3) 25 MCG (1000 UNIT) tablet, Take 1,000 Units by mouth daily., Disp: , Rfl:    erythromycin ophthalmic ointment, Place 1 application into the right eye 3 (three) times daily., Disp: 3.5 g, Rfl: 0   loratadine (CLARITIN) 10 MG tablet, Take 10 mg by mouth daily as needed., Disp: , Rfl:    Multiple Vitamin (MULTIVITAMIN WITH MINERALS) TABS tablet, Take 1 tablet by mouth daily., Disp: , Rfl:   EXAM:  VITALS per patient if applicable:  GENERAL: alert, oriented, appears well and in no acute distress  HEENT: atraumatic,  conjunttiva mildly erythematous on the R with some clear drainage at the times of this visit, EOMI, patient reports vision is intact, no obvious abnormalities on inspection of external nose and ears  NECK: normal movements of the head and neck  LUNGS: on inspection no signs of respiratory distress, breathing rate appears normal, no obvious gross SOB, gasping or wheezing  CV: no obvious cyanosis  MS: moves all visible extremities without noticeable abnormality  PSYCH/NEURO: pleasant and cooperative, no obvious depression or anxiety, speech and thought processing grossly intact  ASSESSMENT AND PLAN:  Discussed the following assessment and plan:  Eye irritation  History of drainage from eye  -we discussed possible serious and likely etiologies, options for evaluation and workup, limitations of telemedicine visit vs in person visit, treatment, treatment risks and precautions. Pt is agreeable to treatment via telemedicine at the moment. She opted for saline rinse, wearing glasses and rx for erythro eye oint.  Advised to seek prompt in person care if worsening, new symptoms arise, or if is not improving with treatment. are.   I discussed the assessment and treatment plan with the patient. The patient was provided an opportunity to ask questions and all were answered. The patient agreed with the plan and demonstrated an understanding of the instructions.     Lucretia Kern, DO

## 2021-04-16 NOTE — Patient Instructions (Signed)
-  I sent the medication(s) we discussed to your pharmacy: Meds ordered this encounter  Medications   erythromycin ophthalmic ointment    Sig: Place 1 application into the right eye 3 (three) times daily.    Dispense:  3.5 g    Refill:  0     I hope you are feeling better soon!  Seek in person care promptly if your symptoms worsen, new concerns arise or you are not improving with treatment.  It was nice to meet you today. I help [facility] out with telemedicine visits on Tuesdays and Thursdays and am available for visits on those days. If you have any concerns or questions following this visit please schedule a follow up visit with your Primary Care doctor or seek care at a local urgent care clinic to avoid delays in care.

## 2021-09-05 ENCOUNTER — Other Ambulatory Visit: Payer: Self-pay | Admitting: Otolaryngology

## 2021-09-05 DIAGNOSIS — H90A21 Sensorineural hearing loss, unilateral, right ear, with restricted hearing on the contralateral side: Secondary | ICD-10-CM

## 2021-09-20 ENCOUNTER — Ambulatory Visit
Admission: RE | Admit: 2021-09-20 | Discharge: 2021-09-20 | Disposition: A | Discharge status: Home / Self Care | Payer: Managed Care, Other (non HMO) | Source: Ambulatory Visit | Attending: Otolaryngology | Admitting: Otolaryngology

## 2021-09-20 ENCOUNTER — Other Ambulatory Visit: Payer: Self-pay

## 2021-09-20 DIAGNOSIS — H90A21 Sensorineural hearing loss, unilateral, right ear, with restricted hearing on the contralateral side: Secondary | ICD-10-CM

## 2021-09-20 MED ORDER — GADOBENATE DIMEGLUMINE 529 MG/ML IV SOLN
15.0000 mL | Freq: Once | INTRAVENOUS | Status: AC | PRN
  Administered 2021-09-20: 15 mL via INTRAVENOUS

## 2021-10-03 ENCOUNTER — Telehealth: Payer: Self-pay | Admitting: Family Medicine

## 2021-10-03 DIAGNOSIS — Z111 Encounter for screening for respiratory tuberculosis: Secondary | ICD-10-CM

## 2021-10-03 NOTE — Telephone Encounter (Signed)
LM making pt aware I have scheduled her for 2/13 at 2pm

## 2021-10-03 NOTE — Telephone Encounter (Signed)
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 Her last cpe was April of 2022.   Please advise if it is ok to place on the lab schedule?

## 2021-10-03 NOTE — Telephone Encounter (Signed)
[place] for lab visit for Quantiferon gold- dx TB screen

## 2021-10-03 NOTE — Telephone Encounter (Signed)
Pt is requesting Quantiferon golf TB test, last OV with you 11/2020 is this okay to order or would you prefer to see them prior to ordering this?

## 2021-10-03 NOTE — Addendum Note (Signed)
Addended by: Patrcia Dolly on: 10/03/2021 04:03 PM   Modules accepted: Orders

## 2021-10-04 ENCOUNTER — Other Ambulatory Visit: Payer: Managed Care, Other (non HMO)

## 2021-10-04 DIAGNOSIS — Z111 Encounter for screening for respiratory tuberculosis: Secondary | ICD-10-CM

## 2021-10-07 ENCOUNTER — Other Ambulatory Visit: Payer: Managed Care, Other (non HMO)

## 2021-10-08 LAB — QUANTIFERON-TB GOLD PLUS
Mitogen-NIL: >10 IU/mL
NIL: 0.04 IU/mL
QuantiFERON-TB Gold Plus: NEGATIVE
TB1-NIL: 0.01 IU/mL
TB2-NIL: 0.01 IU/mL

## 2021-10-09 ENCOUNTER — Telehealth: Payer: Self-pay

## 2021-10-09 NOTE — Telephone Encounter (Signed)
Patient aware of labs.  

## 2021-10-09 NOTE — Telephone Encounter (Signed)
-----   Message from Midge Minium, MD sent at 10/09/2021  7:22 AM EST ----- Negative for TB.  Great news!

## 2021-12-17 ENCOUNTER — Ambulatory Visit (AMBULATORY_SURGERY_CENTER): Payer: Managed Care, Other (non HMO) | Admitting: *Deleted

## 2021-12-17 VITALS — Ht 65.0 in | Wt 180.0 lb

## 2021-12-17 DIAGNOSIS — Z1211 Encounter for screening for malignant neoplasm of colon: Secondary | ICD-10-CM

## 2021-12-17 MED ORDER — PLENVU 140 G PO SOLR: 1.0000 | Freq: Once | ORAL | 0 refills | Status: AC

## 2021-12-17 NOTE — Progress Notes (Signed)
No egg or soy allergy known to patient  ?No issues known to pt with past sedation with any surgeries or procedures ?Patient denies ever being told they had issues or difficulty with intubation  ?No FH of Malignant Hyperthermia ?Pt is not on diet pills ?Pt is not on  home 02  ?Pt is not on blood thinners  ?Pt denies issues with constipation  ?No A fib or A flutter ? ?plenvu Coupon to pt in PV today , Code to Pharmacy and  NO PA's for preps discussed with pt In PV today  ?Discussed with pt there will be an out-of-pocket cost for prep and that varies from $0 to 70 +  dollars - pt verbalized understanding  ?Pt instructed to use Singlecare.com or GoodRx for a price reduction on prep  ? ?PV completed over the phone. Pt verified name, DOB, address and insurance during PV today.  ?Pt mailed instruction packet with copy of consent form to read and not return, and instructions.  ?Pt encouraged to call with questions or issues.  ?If pt has My chart, procedure instructions sent via My Chart  ?Insurance confirmed with pt at [facility] today   ?

## 2021-12-20 ENCOUNTER — Encounter: Payer: Self-pay | Admitting: Gastroenterology

## 2021-12-31 ENCOUNTER — Encounter: Payer: Self-pay | Admitting: Gastroenterology

## 2021-12-31 ENCOUNTER — Ambulatory Visit (AMBULATORY_SURGERY_CENTER): Payer: Managed Care, Other (non HMO) | Admitting: Gastroenterology

## 2021-12-31 VITALS — BP 117/76 | HR 75 | Temp 98.9°F | Resp 13 | Ht 64.5 in | Wt 180.0 lb

## 2021-12-31 DIAGNOSIS — D128 Benign neoplasm of rectum: Secondary | ICD-10-CM

## 2021-12-31 DIAGNOSIS — K621 Rectal polyp: Secondary | ICD-10-CM

## 2021-12-31 DIAGNOSIS — Z1211 Encounter for screening for malignant neoplasm of colon: Secondary | ICD-10-CM | POA: Diagnosis present

## 2021-12-31 MED ORDER — SODIUM CHLORIDE 0.9 % IV SOLN: 500.0000 mL | Freq: Once | INTRAVENOUS | Status: DC

## 2021-12-31 NOTE — Progress Notes (Signed)
[facility] History and Physical ? ? ?Primary Care Physician:  Midge Minium, MD ? ? ?Reason for Procedure:  Colorectal cancer screening ? ?Plan:    Screening colonoscopy with possible interventions as needed ? ? ? ? ?HPI: Tavionna Grout is a very pleasant 51 y.o. female here for screening colonoscopy. ?Denies any nausea, vomiting, abdominal pain, melena or bright red blood per rectum ? ?The risks and benefits as well as alternatives of endoscopic procedure(s) have been discussed and reviewed. All questions answered. The patient agrees to proceed. ? ? ? ?Past Medical History:  ?Diagnosis Date  ? Allergy   ? Asthma   ? History of chicken pox   ? History of chronic bronchitis   ? History of Mayer-Rokitansky-K?ster-Hauser syndrome type II   ? History of urinary tract infection   ? ? ?Past Surgical History:  ?Procedure Laterality Date  ? in vitro    ? 4 rounds  ? WISDOM TOOTH EXTRACTION    ? ? ?Prior to Admission medications   ?Medication Sig Start Date End Date Taking? Authorizing Provider  ?cholecalciferol (VITAMIN D3) 25 MCG (1000 UNIT) tablet Take 1,000 Units by mouth daily.   Yes [provider]  ?Cyanocobalamin (VITAMIN B-12 PO) Take by mouth daily. 5000 mcg   Yes [provider]  ?loratadine (CLARITIN) 10 MG tablet Take 10 mg by mouth daily as needed.   Yes [provider]  ?Multiple Vitamin (MULTIVITAMIN WITH MINERALS) TABS tablet Take 1 tablet by mouth daily.   Yes [provider]  ?Omega-3 Fatty Acids (FISH OIL PO) Take by mouth daily. 2000 mg   Yes [provider]  ? ? ?Current Outpatient Medications  ?Medication Sig Dispense Refill  ? cholecalciferol (VITAMIN D3) 25 MCG (1000 UNIT) tablet Take 1,000 Units by mouth daily.    ? Cyanocobalamin (VITAMIN B-12 PO) Take by mouth daily. 5000 mcg    ? loratadine (CLARITIN) 10 MG tablet Take 10 mg by mouth daily as needed.    ? Multiple Vitamin (MULTIVITAMIN WITH MINERALS) TABS tablet Take 1 tablet by  mouth daily.    ? Omega-3 Fatty Acids (FISH OIL PO) Take by mouth daily. 2000 mg    ? ?Current Facility-Administered Medications  ?Medication Dose Route Frequency Provider Last Rate Last Admin  ? 0.9 %  sodium chloride infusion  500 mL Intravenous Once Aylah Yeary, Venia Minks, MD      ? ? ?Allergies as of 12/31/2021 - Review Complete 12/31/2021  ?Allergen Reaction Noted  ? Prednisone Rash 05/16/2016  ? Penicillins  12/17/2021  ? ? ?Family History  ?Problem Relation Age of Onset  ? Hyperlipidemia Father   ? Arthritis Father   ? Heart disease Father   ? Alzheimer's disease Father   ? Alcohol abuse Paternal Uncle   ? Cancer Paternal Uncle   ?     lung  ? Cancer Maternal Grandmother   ?     Skin  ? Arthritis Maternal Grandmother   ? Alzheimer's disease Maternal Grandmother   ? Arthritis Maternal Grandfather   ? Arthritis Paternal Grandmother   ? Cancer Paternal Grandmother   ?     breast  ? Stomach cancer Paternal Grandfather   ? Cancer Paternal Grandfather   ?     Stomach  ? Arthritis Paternal Grandfather   ? Colon cancer Neg Hx   ? Colon polyps Neg Hx   ? Esophageal cancer Neg Hx   ? Rectal cancer Neg Hx   ? Crohn's disease Neg  Hx   ? ? ?Social History  ? ?Socioeconomic History  ? Marital status: Married  ?  Spouse name: Not on file  ? Number of children: Not on file  ? Years of education: Not on file  ? Highest education level: Not on file  ?Occupational History  ? Not on file  ?Tobacco Use  ? Smoking status: Never  ? Smokeless tobacco: Never  ?Vaping Use  ? Vaping Use: Never used  ?Substance and Sexual Activity  ? Alcohol use: Yes  ?  Comment: 2-3 a week  ? Drug use: No  ? Sexual activity: Not on file  ?Other Topics Concern  ? Not on file  ?Social History Narrative  ? Not on file  ? ?Social Determinants of Health  ? ?Financial Resource Strain: Not on file  ?Food Insecurity: Not on file  ?Transportation Needs: Not on file  ?Physical Activity: Not on file  ?Stress: Not on file  ?Social Connections: Not on file  ?Intimate  Partner Violence: Not on file  ? ? ?Review of Systems: ? ?All other review of systems negative except as mentioned in the HPI. ? ?Physical Exam: ?Vital signs in last 24 hours: ?BP (!) 122/91   Pulse 75   Temp 98.9 ?F (37.2 ?C) (Temporal)   Ht 5' 4.5" (1.638 m)   Wt 180 lb (81.6 kg)   SpO2 100%   BMI 30.42 kg/m?  ?General:   Alert, NAD ?Lungs:  Clear .   ?Heart:  Regular rate and rhythm ?Abdomen:  Soft, nontender and nondistended. ?Neuro/Psych:  Alert and cooperative. Normal mood and affect. A and O x 3 ? ?Reviewed labs, radiology imaging, old records and pertinent past GI work up ? ?Patient is appropriate for planned procedure(s) and anesthesia in an ambulatory setting ? ? ?K. Denzil Magnuson , MD ?[phone]  ? ? ?  ?

## 2021-12-31 NOTE — Patient Instructions (Signed)
? ?  Handouts on polyps given.  ? ?YOU HAD AN ENDOSCOPIC PROCEDURE TODAY AT [facility]:   Refer to the procedure report that was given to you for any specific questions about what was found during the examination.  If the procedure report does not answer your questions, please call your gastroenterologist to clarify.  If you requested that your care partner not be given the details of your procedure findings, then the procedure report has been included in a sealed envelope for you to review at your convenience later. ? ?YOU SHOULD EXPECT: Some feelings of bloating in the abdomen. Passage of more gas than usual.  Walking can help get rid of the air that was put into your GI tract during the procedure and reduce the bloating. If you had a lower endoscopy (such as a colonoscopy or flexible sigmoidoscopy) you may notice spotting of blood in your stool or on the toilet paper. If you underwent a bowel prep for your procedure, you may not have a normal bowel movement for a few days. ? ?Please Note:  You might notice some irritation and congestion in your nose or some drainage.  This is from the oxygen used during your procedure.  There is no need for concern and it should clear up in a day or so. ? ?SYMPTOMS TO REPORT IMMEDIATELY: ? ?Following lower endoscopy (colonoscopy or flexible sigmoidoscopy): ? Excessive amounts of blood in the stool ? Significant tenderness or worsening of abdominal pains ? Swelling of the abdomen that is new, acute ? Fever of 100?F or higher ? ? ?For urgent or emergent issues, a gastroenterologist can be reached at any hour by calling [phone]. ?Do not use MyChart messaging for urgent concerns.  ? ? ?DIET:  We do recommend a small meal at first, but then you may proceed to your regular diet.  Drink plenty of fluids but you should avoid alcoholic beverages for 24 hours. ? ?ACTIVITY:  You should plan to take it easy for the rest of today and you should NOT DRIVE or use heavy  machinery until tomorrow (because of the sedation medicines used during the test).   ? ?FOLLOW UP: ?Our staff will call the number listed on your records 48-72 hours following your procedure to check on you and address any questions or concerns that you may have regarding the information given to you following your procedure. If we do not reach you, we will leave a message.  We will attempt to reach you two times.  During this call, we will ask if you have developed any symptoms of COVID 19. If you develop any symptoms (ie: fever, flu-like symptoms, shortness of breath, cough etc.) before then, please call [phone].  If you test positive for Covid 19 in the 2 weeks post procedure, please call and report this information to [place].   ? ?If any biopsies were taken you will be contacted by phone or by letter within the next 1-3 weeks.  Please call us at [phone] if you have not heard about the biopsies in 3 weeks.  ? ? ?SIGNATURES/CONFIDENTIALITY: ?You and/or your care partner have signed paperwork which will be entered into your electronic medical record.  These signatures attest to the fact that that the information above on your After Visit Summary has been reviewed and is understood.  Full responsibility of the confidentiality of this discharge information lies with you and/or your care-partner.  ?

## 2021-12-31 NOTE — Op Note (Signed)
[facility] ?Patient Name: Kristi Dillon ?Procedure Date: 12/31/2021 11:35 AM ?MRN: [id number] ?Endoscopist: Mauri Pole , MD ?Age: 51 ?Referring MD:  ?Date of Birth: 07/31/1971 ?Gender: Female ?Account #: [id number] ?Procedure:                Colonoscopy ?Indications:              Screening for colorectal malignant neoplasm ?Medicines:                Monitored Anesthesia Care ?Procedure:                Pre-Anesthesia Assessment: ?                          - Prior to the procedure, a History and Physical  ?                          was performed, and patient medications and  ?                          allergies were reviewed. The patient's tolerance of  ?                          previous anesthesia was also reviewed. The risks  ?                          and benefits of the procedure and the sedation  ?                          options and risks were discussed with the patient.  ?                          All questions were answered, and informed consent  ?                          was obtained. Prior Anticoagulants: The patient has  ?                          taken no previous anticoagulant or antiplatelet  ?                          agents. ASA Grade Assessment: I - A normal, healthy  ?                          patient. After reviewing the risks and benefits,  ?                          the patient was deemed in satisfactory condition to  ?                          undergo the procedure. ?                          After obtaining informed consent, the colonoscope  ?  was passed under direct vision. Throughout the  ?                          procedure, the patient's blood pressure, pulse, and  ?                          oxygen saturations were monitored continuously. The  ?                          Olympus PCF-[id number] (#[id number]) Colonoscope was  ?                          introduced through the anus and advanced to the the  ?                          cecum, identified by  appendiceal orifice and  ?                          ileocecal valve. The colonoscopy was performed  ?                          without difficulty. The patient tolerated the  ?                          procedure well. The quality of the bowel  ?                          preparation was excellent. The ileocecal valve,  ?                          appendiceal orifice, and rectum were photographed. ?Scope In: 11:41:27 AM ?Scope Out: 11:58:47 AM ?Scope Withdrawal Time: 0 hours 10 minutes 57 seconds  ?Total Procedure Duration: 0 hours 17 minutes 20 seconds  ?Findings:                 The perianal and digital rectal examinations were  ?                          normal. ?                          Two sessile polyps were found in the rectum. The  ?                          polyps were 2 to 4 mm in size. These polyps were  ?                          removed with a cold snare. Resection and retrieval  ?                          were complete. ?                          Scattered small-mouthed diverticula were found in  ?  the sigmoid colon and descending colon. ?                          Non-bleeding external and internal hemorrhoids were  ?                          found during retroflexion. The hemorrhoids were  ?                          small. ?                          The exam was otherwise without abnormality. ?Complications:            No immediate complications. ?Estimated Blood Loss:     Estimated blood loss was minimal. ?Impression:               - Two 2 to 4 mm polyps in the rectum, removed with  ?                          a cold snare. Resected and retrieved. ?                          - Diverticulosis in the sigmoid colon and in the  ?                          descending colon. ?                          - Non-bleeding external and internal hemorrhoids. ?                          - The examination was otherwise normal. ?Recommendation:           - Patient has a contact number available for   ?                          emergencies. The signs and symptoms of potential  ?                          delayed complications were discussed with the  ?                          patient. Return to normal activities tomorrow.  ?                          Written discharge instructions were provided to the  ?                          patient. ?                          - Resume previous diet. ?                          - Continue present medications. ?                          -  Await pathology results. ?                          - Repeat colonoscopy in 7-10 years for surveillance  ?                          based on pathology results. ?Mauri Pole, MD ?12/31/2021 12:08:07 PM ?This report has been signed electronically. ?

## 2021-12-31 NOTE — Progress Notes (Signed)
Report to pacu rn; vss. ?

## 2021-12-31 NOTE — Progress Notes (Signed)
Called to room to assist during endoscopic procedure.  Patient ID and intended procedure confirmed with present staff. Received instructions for my participation in the procedure from the performing physician.  

## 2022-01-02 ENCOUNTER — Telehealth: Payer: Self-pay | Admitting: *Deleted

## 2022-01-02 NOTE — Telephone Encounter (Signed)
?  Follow up Call- ? ? ?  12/31/2021  ? 11:08 AM  ?Call back number  ?Post procedure Call Back phone  # [phone]  ?Permission to leave phone message Yes  ?  ? ?Patient questions: ? ?Do you have a fever, pain , or abdominal swelling? No. ?Pain Score  0 * ? ?Have you tolerated food without any problems? Yes.   ? ?Have you been able to return to your normal activities? Yes.   ? ?Do you have any questions about your discharge instructions: ?Diet   No. ?Medications  No. ?Follow up visit  No. ? ?Do you have questions or concerns about your Care? No. ? ?Actions: ?* If pain score is 4 or above: ?No action needed, pain <4. ? ? ?

## 2022-01-03 ENCOUNTER — Encounter: Payer: Self-pay | Admitting: Gastroenterology

## 2022-08-31 IMAGING — MR MR HEAD WO/W CM
11 of 12 series · 36 of 48 positions shown · IV contrast (multihance)
Comparison: None.

CLINICAL DATA: Sudden onset of left-sided hearing loss.

EXAM:
MRI HEAD WITHOUT AND WITH CONTRAST
TECHNIQUE: Multiplanar, multiecho pulse sequences of the brain and surrounding
structures were obtained without and with intravenous contrast.
CONTRAST:  15mL MULTIHANCE GADOBENATE DIMEGLUMINE 529 MG/ML IV SOLN

[Series 2: T1 · sagittal · 5.0mm · 0.45mm/px · 3 of 21 slices shown (1 of 3)]
[im 1/21]
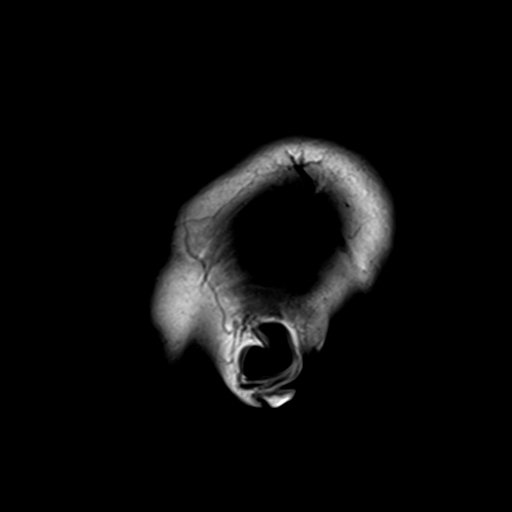
[im 11/21]
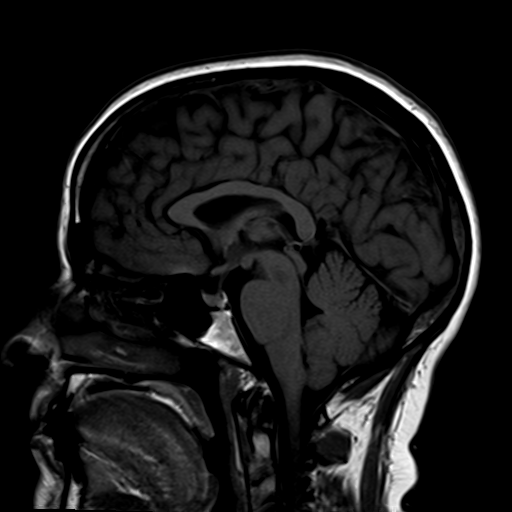
[im 21/21]
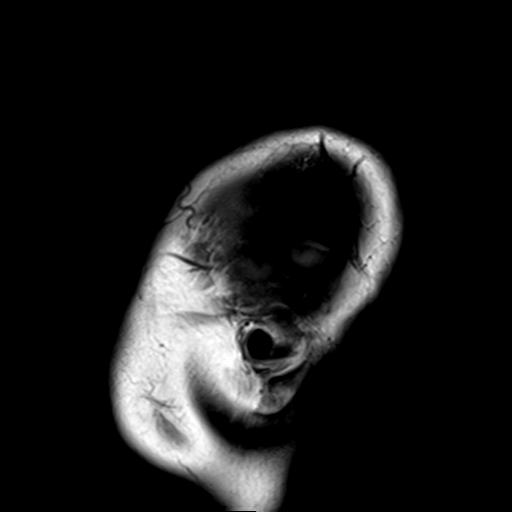

[Series 3: DWI · axial · 3.0mm · 1.80mm/px · z∈[-42,+103]mm · 9 of 100 slices shown (1 of 2)]
[im 1/100]
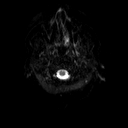
[im 19/100]
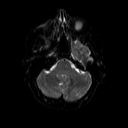
[im 28/100]
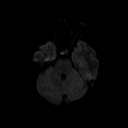
[im 46/100]
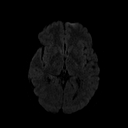
[im 55/100]
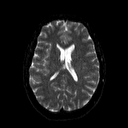
[im 73/100]
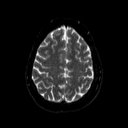
[im 82/100]
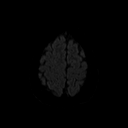
[im 91/100]
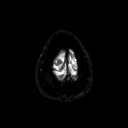
[im 100/100]
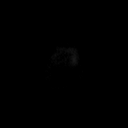

[Series 4: DWI · axial · 3.0mm · 1.80mm/px · z∈[-42,+103]mm · 6 of 50 slices shown (2 of 2)]
[im 1/50]
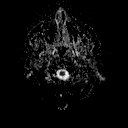
[im 10/50]
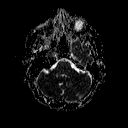
[im 20/50]
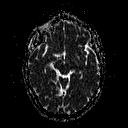
[im 30/50]
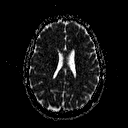
[im 40/50]
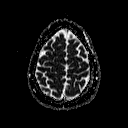
[im 50/50]
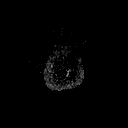

[Series 5: T2 · axial · 5.0mm · 0.45mm/px · z∈[-50,+111]mm · 3 of 26 slices shown]
[im 1/26]
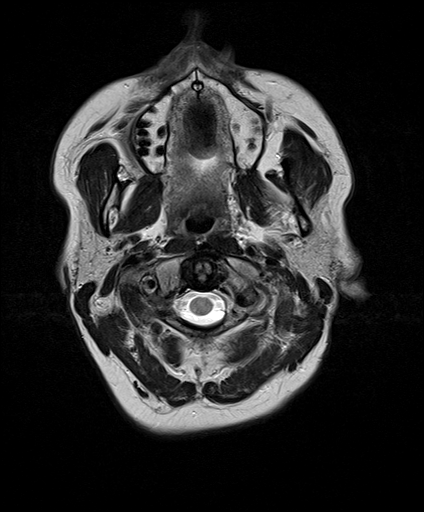
[im 13/26]
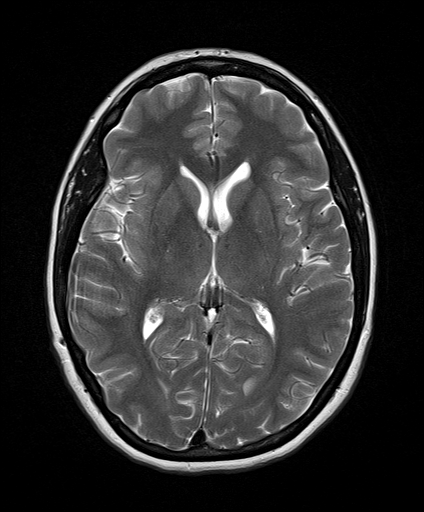
[im 26/26]
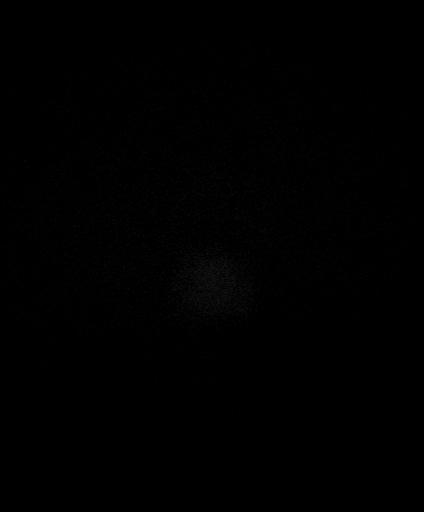

[Series 6: FLAIR · axial · 3.0mm · 0.45mm/px · z∈[-53,+102]mm · 3 of 27 slices shown]
[im 1/27]
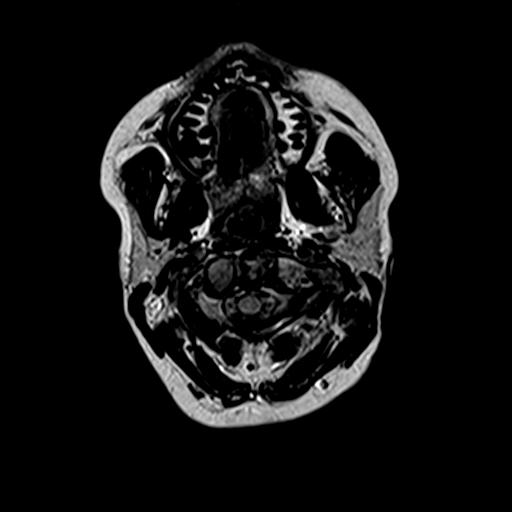
[im 14/27]
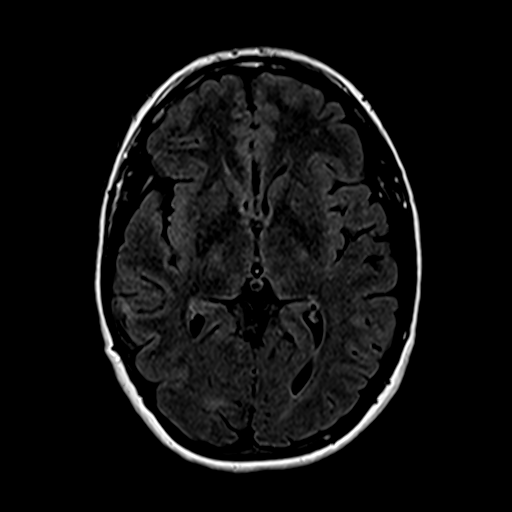
[im 27/27]
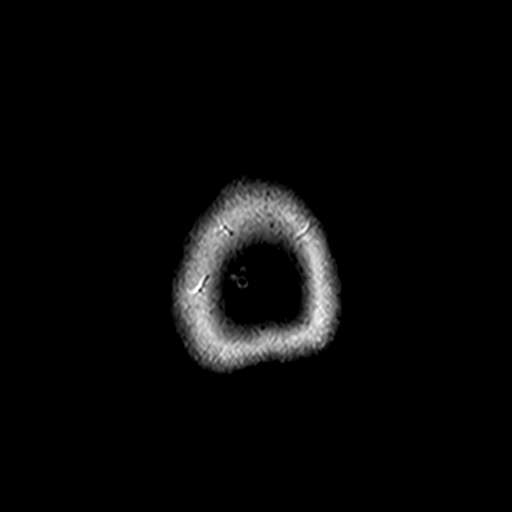

[Series 8: swi_images · axial · 3.0mm · 0.90mm/px · z∈[-39,+101]mm · 5 of 48 slices shown]
[im 1/48]
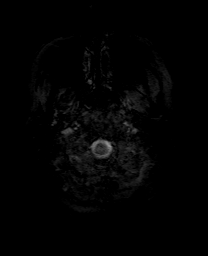
[im 12/48]
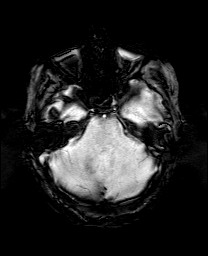
[im 24/48]
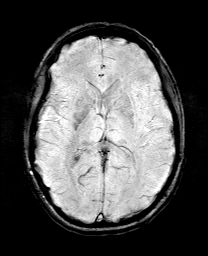
[im 36/48]
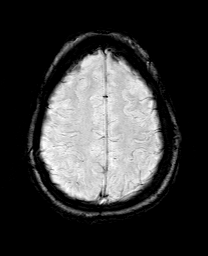
[im 48/48]
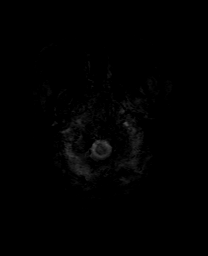

[Series 9: T1 · coronal · 3.0mm · 0.35mm/px · 1 of 11 slices shown (2 of 3)]
[im 1/11]
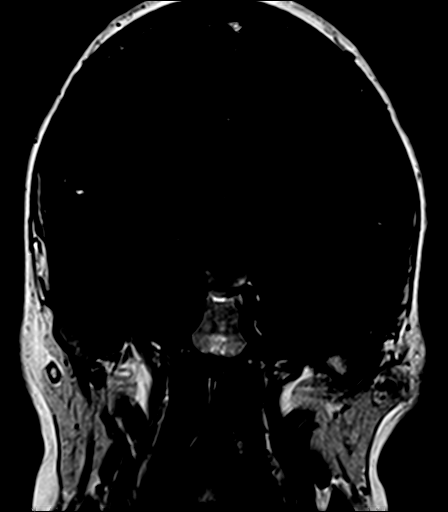

[Series 10: T1 · axial · 3.0mm · 0.35mm/px · 1 of 11 slices shown (3 of 3)]
[im 1/11]
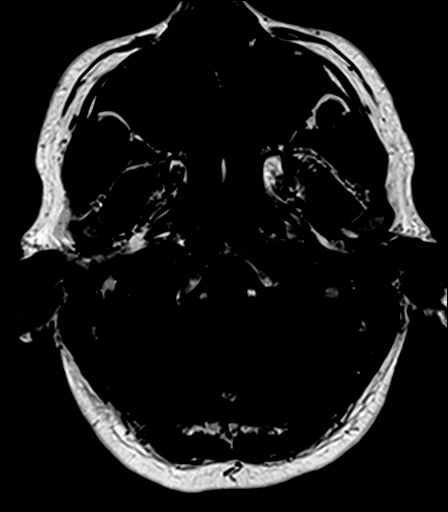

[Series 11: bSSFP · axial · 1.0mm · 0.28mm/px · z∈[-29,-6]mm · 3 of 36 slices shown]
[im 1/36]
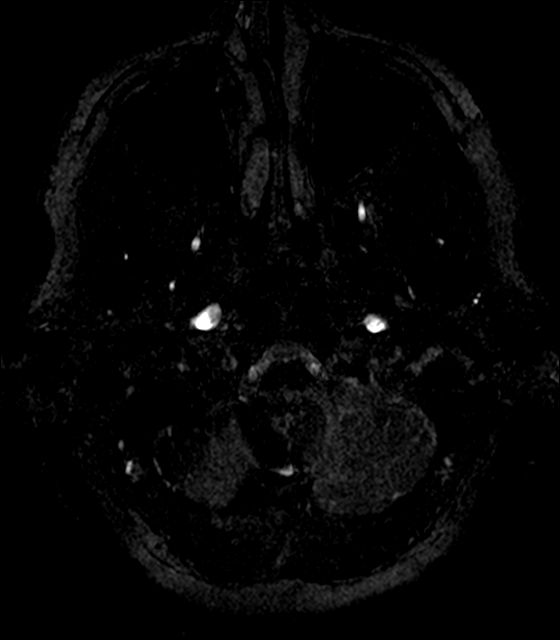
[im 12/36]
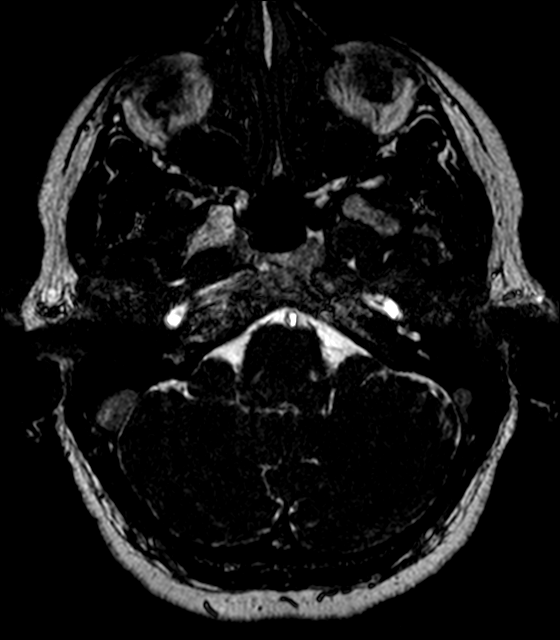
[im 24/36]
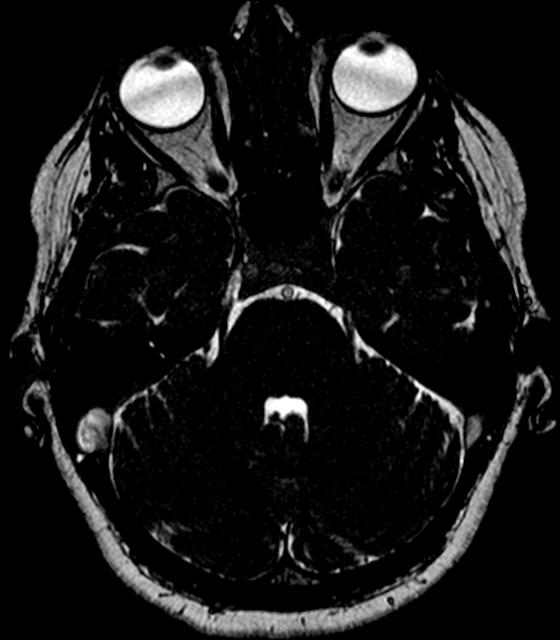

[Series 12: T1 post-contrast · coronal · 3.0mm · 0.35mm/px · 1 of 11 slices shown (1 of 2)]
[im 1/11]
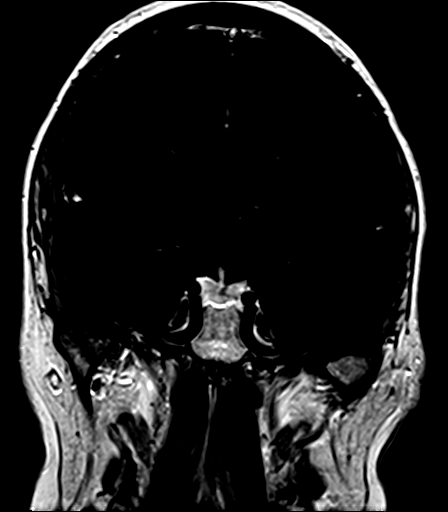

[Series 13: T1 post-contrast · axial · 3.0mm · 0.35mm/px · 1 of 11 slices shown (2 of 2)]
[im 1/11]
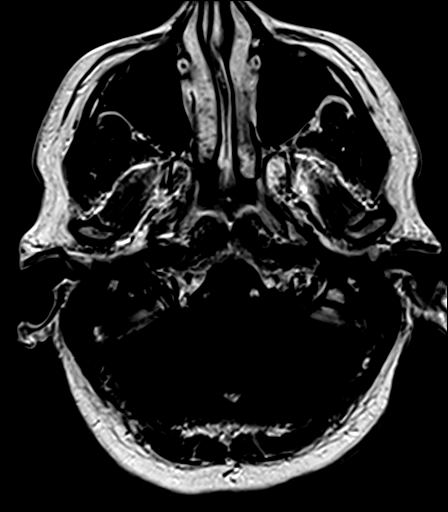

[36 of 48 positions shown; findings below may reference images not displayed]

FINDINGS: Brain: Imaging the brain is unremarkable. No acute infarct,
hemorrhage, or mass lesion is present. No significant white matter
lesions are present. The ventricles are of normal size. No
significant extraaxial fluid collection is present.

The brainstem and cerebellum are within normal limits. Postcontrast
images demonstrate no pathologic enhancement.

Vascular: Flow is present in the major intracranial arteries.

Skull and upper cervical spine: The craniocervical junction is
normal. Upper cervical spine is within normal limits. Marrow signal
is unremarkable.

Sinuses/Orbits: Mild mucosal thickening is present in the anterior
ethmoid air cells. No fluid levels are present. The paranasal
sinuses and mastoid air cells are otherwise clear. The globes and
orbits are within normal limits.

Other: Dedicated imaging of the internal auditory canals
demonstrates no significant enhancement or mass lesion.

High-resolution imaging demonstrates a discrete appearance of the
seventh and eighth cranial nerves artery within normal limits. The
inner ear structures are unremarkable.
IMPRESSION: 1. No acute or focal lesion to explain sensorineural hearing loss.
2. Normal MRI appearance of the brain.

## 2022-10-21 ENCOUNTER — Other Ambulatory Visit: Payer: Managed Care, Other (non HMO)

## 2022-10-21 DIAGNOSIS — Z111 Encounter for screening for respiratory tuberculosis: Secondary | ICD-10-CM

## 2022-10-23 ENCOUNTER — Telehealth: Payer: Self-pay

## 2022-10-23 LAB — QUANTIFERON-TB GOLD PLUS
Mitogen-NIL: >10 IU/mL
NIL: 0.03 IU/mL
QuantiFERON-TB Gold Plus: NEGATIVE
TB1-NIL: 0 IU/mL
TB2-NIL: 0 IU/mL

## 2022-10-23 NOTE — Telephone Encounter (Signed)
Left results on pt VM

## 2022-10-23 NOTE — Telephone Encounter (Signed)
-----   Message from Midge Minium, MD sent at 10/23/2022  7:31 AM EST ----- Negative (normal) quantiferon
